# Patient Record
Sex: Male | Born: 1950 | Hispanic: Yes | Marital: Married | Smoking: Never smoker
Health system: Southern US, Community
[De-identification: ages and names within clinical notes are randomized; demographics above are authoritative.]

## PROBLEM LIST (undated history)

## (undated) DIAGNOSIS — E785 Hyperlipidemia, unspecified: Secondary | ICD-10-CM

## (undated) DIAGNOSIS — I10 Essential (primary) hypertension: Secondary | ICD-10-CM

## (undated) DIAGNOSIS — E119 Type 2 diabetes mellitus without complications: Secondary | ICD-10-CM

## (undated) HISTORY — DX: Hyperlipidemia, unspecified: E78.5

---

## 2008-12-27 ENCOUNTER — Emergency Department (HOSPITAL_COMMUNITY): Admission: EM | Admit: 2008-12-27 | Discharge: 2008-12-27 | Payer: Self-pay | Admitting: Emergency Medicine

## 2010-08-03 ENCOUNTER — Encounter: Payer: Self-pay | Admitting: Emergency Medicine

## 2010-10-19 LAB — URINALYSIS, ROUTINE W REFLEX MICROSCOPIC
Ketones, ur: 40 mg/dL — AB
Nitrite: NEGATIVE
Protein, ur: NEGATIVE mg/dL
Urobilinogen, UA: 0.2 mg/dL (ref 0.0–1.0)
pH: 8.5 — ABNORMAL HIGH (ref 5.0–8.0)

## 2010-10-19 LAB — COMPREHENSIVE METABOLIC PANEL
ALT: 33 U/L (ref 0–53)
CO2: 28 mEq/L (ref 19–32)
Calcium: 9.1 mg/dL (ref 8.4–10.5)
Chloride: 104 mEq/L (ref 96–112)
Creatinine, Ser: 1 mg/dL (ref 0.4–1.5)
GFR calc non Af Amer: >60 mL/min (ref >60)
Glucose, Bld: 161 mg/dL — ABNORMAL HIGH (ref 70–99)
Sodium: 140 mEq/L (ref 135–145)
Total Bilirubin: 0.6 mg/dL (ref 0.3–1.2)

## 2010-10-19 LAB — LIPASE, BLOOD: Lipase: 21 U/L (ref 11–59)

## 2010-10-19 LAB — CBC
Hemoglobin: 13.6 g/dL (ref 13.0–17.0)
MCHC: 34.3 g/dL (ref 30.0–36.0)
MCV: 90.1 fL (ref 78.0–100.0)
RBC: 4.42 MIL/uL (ref 4.22–5.81)

## 2010-10-19 LAB — DIFFERENTIAL
Basophils Absolute: 0 10*3/uL (ref 0.0–0.1)
Eosinophils Absolute: 0 10*3/uL (ref 0.0–0.7)
Lymphs Abs: 1.1 10*3/uL (ref 0.7–4.0)
Neutrophils Relative %: 81 % — ABNORMAL HIGH (ref 43–77)

## 2011-05-06 ENCOUNTER — Encounter: Payer: Self-pay | Admitting: Emergency Medicine

## 2011-05-06 ENCOUNTER — Emergency Department (HOSPITAL_COMMUNITY): Payer: Self-pay

## 2011-05-06 ENCOUNTER — Other Ambulatory Visit: Payer: Self-pay

## 2011-05-06 ENCOUNTER — Emergency Department (HOSPITAL_COMMUNITY)
Admission: EM | Admit: 2011-05-06 | Discharge: 2011-05-06 | Disposition: A | Discharge status: Home / Self Care | Payer: Self-pay | Attending: Emergency Medicine | Admitting: Emergency Medicine

## 2011-05-06 DIAGNOSIS — R059 Cough, unspecified: Secondary | ICD-10-CM | POA: Insufficient documentation

## 2011-05-06 DIAGNOSIS — R079 Chest pain, unspecified: Secondary | ICD-10-CM | POA: Insufficient documentation

## 2011-05-06 DIAGNOSIS — R05 Cough: Secondary | ICD-10-CM | POA: Insufficient documentation

## 2011-05-06 DIAGNOSIS — R0602 Shortness of breath: Secondary | ICD-10-CM | POA: Insufficient documentation

## 2011-05-06 HISTORY — DX: Essential (primary) hypertension: I10

## 2011-05-06 LAB — BASIC METABOLIC PANEL
Calcium: 9.2 mg/dL (ref 8.4–10.5)
GFR calc Af Amer: >90 mL/min (ref >90)
GFR calc non Af Amer: 89 mL/min — ABNORMAL LOW (ref >90)
Potassium: 3.8 mEq/L (ref 3.5–5.1)
Sodium: 139 mEq/L (ref 135–145)

## 2011-05-06 LAB — TROPONIN I: Troponin I: <0.3 ng/mL (ref <0.30)

## 2011-05-06 LAB — CBC
Hemoglobin: 12.9 g/dL — ABNORMAL LOW (ref 13.0–17.0)
MCH: 28.5 pg (ref 26.0–34.0)
MCHC: 32.4 g/dL (ref 30.0–36.0)
Platelets: 284 10*3/uL (ref 150–400)
RDW: 14.5 % (ref 11.5–15.5)

## 2011-05-06 NOTE — ED Provider Notes (Signed)
History     CSN: [id number] Arrival date & time: 05/06/2011  3:40 PM   First MD Initiated Contact with Patient 05/06/11 1553      Chief Complaint  Patient presents with  . Cough  . Shortness of Breath  . Chest Pain    (Consider location/radiation/quality/duration/timing/severity/associated sxs/prior treatment) Patient is a 60 y.o. male presenting with cough, shortness of breath, and chest pain. The history is provided by the patient.  Cough Associated symptoms include chest pain and shortness of breath. Pertinent negatives include no headaches.  Shortness of Breath  Associated symptoms include chest pain, cough and shortness of breath.  Chest Pain Primary symptoms include shortness of breath and cough. Pertinent negatives for primary symptoms include no abdominal pain, no nausea and no vomiting.  Pertinent negatives for associated symptoms include no numbness and no weakness.    patient has had chest pain cough and some shortness of breath occasionally for last 20 years. He states that he had an episode back a few months ago another one a few weeks ago. [facility], but the EKG machine is broken he was sent here. He is reportedly hypertensive and bradycardic there. He states that he has had these pains for years. He does not have any pain now. He has an occasional cough. No weight loss. Translator phones were used. No diaphoresis. No fevers. He does not smoke or have a family history of cardiac disease.  Past Medical History  Diagnosis Date  . Hypertension     History reviewed. No pertinent past surgical history.  History reviewed. No pertinent family history.  History  Substance Use Topics  . Smoking status: Not on file  . Smokeless tobacco: Not on file  . Alcohol Use: No      Review of Systems  Constitutional: Negative for activity change and appetite change.  HENT: Negative for neck stiffness.   Eyes: Negative for pain.  Respiratory:  Positive for cough and shortness of breath. Negative for chest tightness.   Cardiovascular: Positive for chest pain. Negative for leg swelling.  Gastrointestinal: Negative for nausea, vomiting, abdominal pain and diarrhea.  Genitourinary: Negative for flank pain.  Musculoskeletal: Negative for back pain.  Skin: Negative for rash.  Neurological: Negative for weakness, numbness and headaches.  Psychiatric/Behavioral: Negative for behavioral problems.    Allergies  Review of patient's allergies indicates no known allergies.  Home Medications  No current outpatient prescriptions on file.  BP 144/68  Pulse 61  Temp(Src) 98.4 F (36.9 C) (Oral)  Resp 20  Wt 165 lb (74.844 kg)  SpO2 97%  Physical Exam  Nursing note and vitals reviewed. Constitutional: He is oriented to person, place, and time. He appears well-developed and well-nourished.  HENT:  Head: Normocephalic and atraumatic.  Eyes: EOM are normal. Pupils are equal, round, and reactive to light.  Neck: Normal range of motion. Neck supple.  Cardiovascular: Normal rate, regular rhythm and normal heart sounds.   No murmur heard. Pulmonary/Chest: Effort normal and breath sounds normal.  Abdominal: Soft. Bowel sounds are normal. He exhibits no distension and no mass. There is no tenderness. There is no rebound and no guarding.  Musculoskeletal: Normal range of motion. He exhibits no edema.  Neurological: He is alert and oriented to person, place, and time. No cranial nerve deficit.  Skin: Skin is warm and dry.  Psychiatric: He has a normal mood and affect.    ED Course  Procedures (including critical care time)  Labs  Reviewed  CBC - Abnormal; Notable for the following:    Hemoglobin 12.9 (*)    All other components within normal limits  BASIC METABOLIC PANEL - Abnormal; Notable for the following:    Glucose, Bld 113 (*)    GFR calc non Af Amer 89 (*)    All other components within normal limits  TROPONIN I   Dg Chest 2  View  05/06/2011  *RADIOLOGY REPORT*  Clinical Data: Chest pain.  CHEST - 2 VIEW  Comparison: None  Findings: The cardiac silhouette, mediastinal and hilar contours are within normal limits.  The lungs are clear.  No pleural effusion.  The bony thorax is intact  IMPRESSION: No acute cardiopulmonary findings.  Original Report Authenticated By: P. Loralie Champagne, M.D.     1. Chest pain     Date: 05/06/2011  Rate: 56  Rhythm: sinus bradycardia  QRS Axis: normal  Intervals: normal  ST/T Wave abnormalities: normal  Conduction Disutrbances:none  Narrative Interpretation:   Old EKG Reviewed: none available     MDM  Chest pain. Said from health department. EKG reassuring. Enzymes negative. Doubt cardiac cause. He'll be discharged home.        Juliet Rude. Rubin Payor, MD 05/06/11 1730

## 2011-05-06 NOTE — ED Notes (Signed)
Dr. Rubin Payor in with pt. Will assess pt when he is finished.

## 2011-05-06 NOTE — ED Notes (Signed)
Pt c/o cough, sob and chest pain when he coughs. Pt was sent over by the health dept.

## 2012-10-14 IMAGING — CR DG CHEST 2V
2 series · 2 of 2 positions shown · non-contrast
Comparison: None

CLINICAL DATA: Chest pain.

CHEST - 2 VIEW

[view not recorded (1 of 2)]
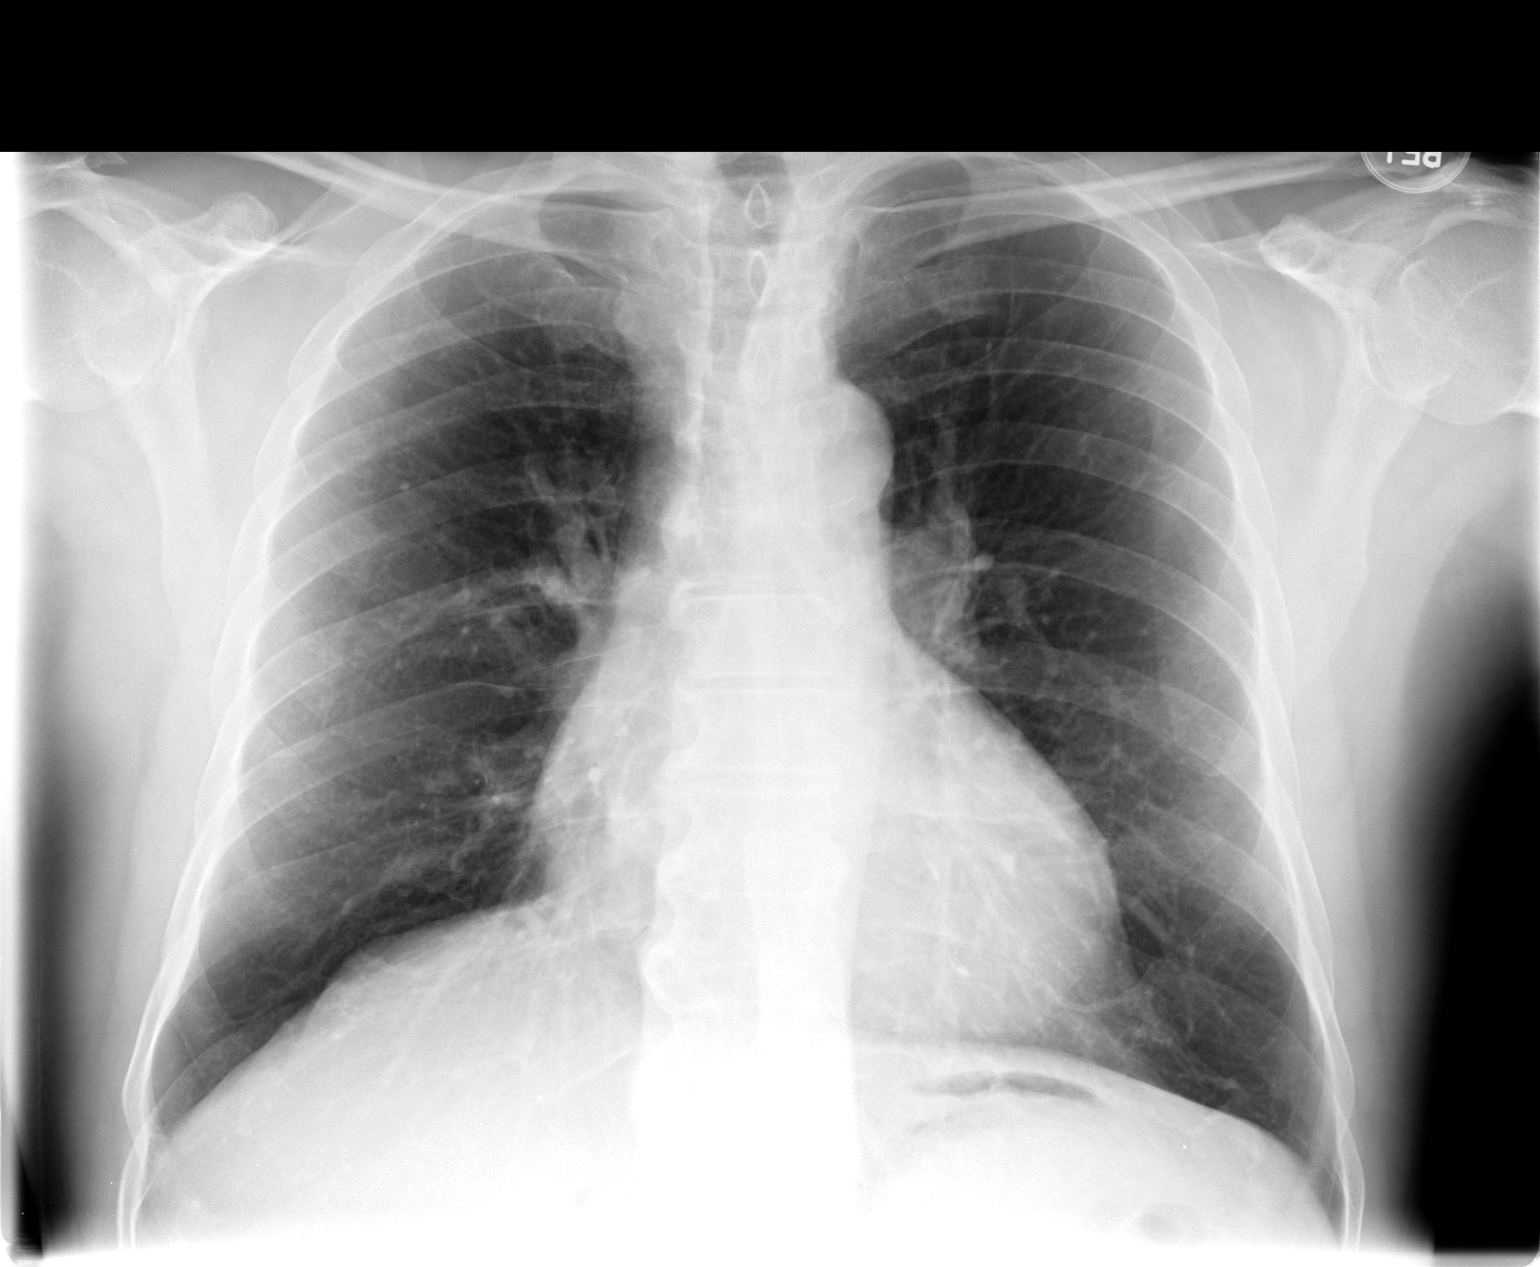

[view not recorded (2 of 2)]
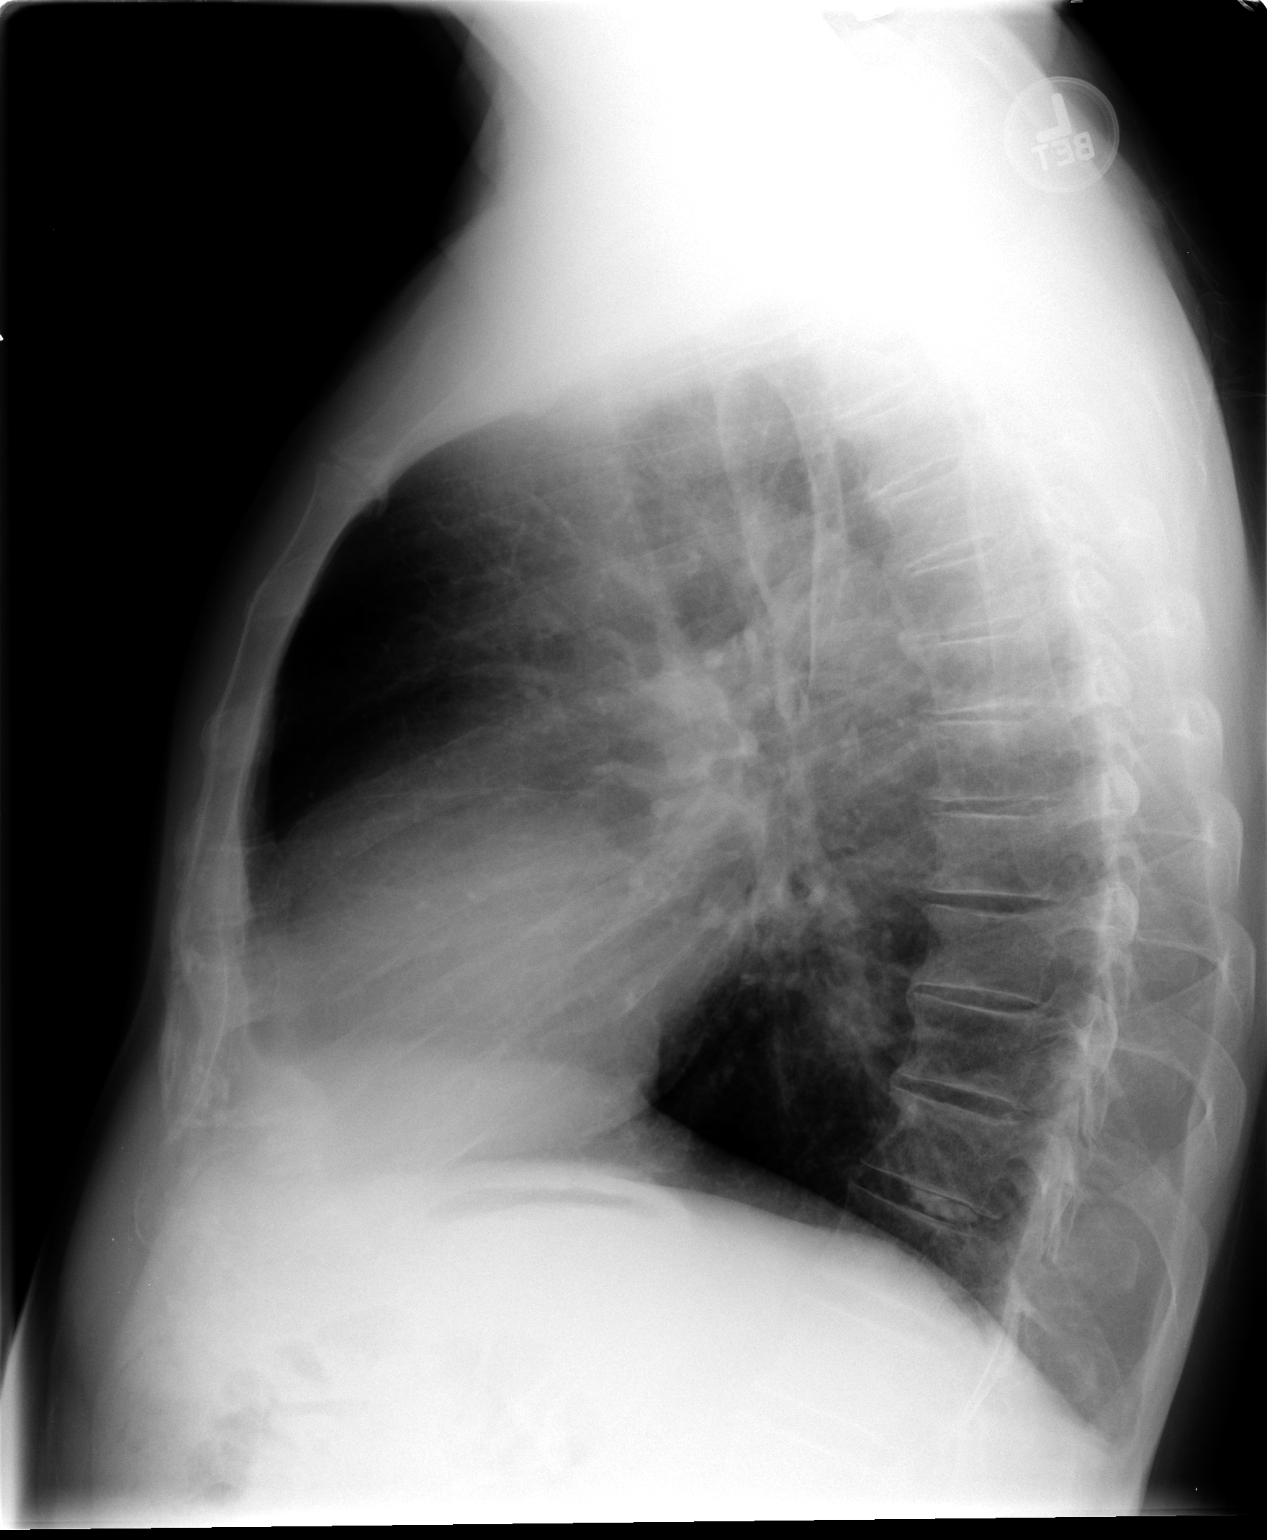

[2 of 2 positions shown; findings below may reference images not displayed]

FINDINGS: The cardiac silhouette, mediastinal and hilar contours
are within normal limits.  The lungs are clear.  No pleural
effusion.  The bony thorax is intact
IMPRESSION: No acute cardiopulmonary findings.

## 2012-11-13 ENCOUNTER — Ambulatory Visit (HOSPITAL_COMMUNITY)
Admission: RE | Admit: 2012-11-13 | Discharge: 2012-11-13 | Disposition: A | Discharge status: Home / Self Care | Payer: Self-pay | Source: Ambulatory Visit | Attending: *Deleted | Admitting: *Deleted

## 2012-11-13 ENCOUNTER — Other Ambulatory Visit (HOSPITAL_COMMUNITY): Payer: Self-pay | Admitting: *Deleted

## 2012-11-13 DIAGNOSIS — R05 Cough: Secondary | ICD-10-CM

## 2012-11-13 DIAGNOSIS — R059 Cough, unspecified: Secondary | ICD-10-CM | POA: Insufficient documentation

## 2016-07-21 ENCOUNTER — Encounter (HOSPITAL_COMMUNITY): Payer: Self-pay | Admitting: Emergency Medicine

## 2016-07-21 ENCOUNTER — Emergency Department (HOSPITAL_COMMUNITY): Payer: Self-pay

## 2016-07-21 ENCOUNTER — Observation Stay (HOSPITAL_COMMUNITY)
Admission: EM | Admit: 2016-07-21 | Discharge: 2016-07-23 | Disposition: A | Discharge status: Home / Self Care | Payer: Self-pay | Attending: Internal Medicine | Admitting: Internal Medicine

## 2016-07-21 DIAGNOSIS — D509 Iron deficiency anemia, unspecified: Secondary | ICD-10-CM

## 2016-07-21 DIAGNOSIS — Z79899 Other long term (current) drug therapy: Secondary | ICD-10-CM | POA: Insufficient documentation

## 2016-07-21 DIAGNOSIS — I10 Essential (primary) hypertension: Secondary | ICD-10-CM | POA: Insufficient documentation

## 2016-07-21 DIAGNOSIS — E119 Type 2 diabetes mellitus without complications: Secondary | ICD-10-CM | POA: Insufficient documentation

## 2016-07-21 DIAGNOSIS — R079 Chest pain, unspecified: Principal | ICD-10-CM

## 2016-07-21 DIAGNOSIS — E1169 Type 2 diabetes mellitus with other specified complication: Secondary | ICD-10-CM

## 2016-07-21 HISTORY — DX: Type 2 diabetes mellitus without complications: E11.9

## 2016-07-21 LAB — CBC WITH DIFFERENTIAL/PLATELET
Basophils Absolute: 0.1 10*3/uL (ref 0.0–0.1)
Basophils Relative: 1 %
Eosinophils Absolute: 0.5 10*3/uL (ref 0.0–0.7)
Eosinophils Relative: 11 %
HEMATOCRIT: 33.9 % — AB (ref 39.0–52.0)
HEMOGLOBIN: 10.7 g/dL — AB (ref 13.0–17.0)
LYMPHS ABS: 2 10*3/uL (ref 0.7–4.0)
Lymphocytes Relative: 42 %
MCH: 23.5 pg — AB (ref 26.0–34.0)
MCHC: 31.6 g/dL (ref 30.0–36.0)
MCV: 74.5 fL — ABNORMAL LOW (ref 78.0–100.0)
MONO ABS: 0.4 10*3/uL (ref 0.1–1.0)
MONOS PCT: 7 %
NEUTROS ABS: 1.9 10*3/uL (ref 1.7–7.7)
NEUTROS PCT: 39 %
Platelets: 318 10*3/uL (ref 150–400)
RBC: 4.55 MIL/uL (ref 4.22–5.81)
RDW: 19.3 % — AB (ref 11.5–15.5)
WBC: 4.7 10*3/uL (ref 4.0–10.5)

## 2016-07-21 LAB — COMPREHENSIVE METABOLIC PANEL
ALK PHOS: 44 U/L (ref 38–126)
ALT: 58 U/L (ref 17–63)
ANION GAP: 11 (ref 5–15)
AST: 43 U/L — ABNORMAL HIGH (ref 15–41)
Albumin: 3.9 g/dL (ref 3.5–5.0)
BILIRUBIN TOTAL: 0.4 mg/dL (ref 0.3–1.2)
BUN: 16 mg/dL (ref 6–20)
CALCIUM: 9.3 mg/dL (ref 8.9–10.3)
CO2: 28 mmol/L (ref 22–32)
CREATININE: 0.78 mg/dL (ref 0.61–1.24)
Chloride: 98 mmol/L — ABNORMAL LOW (ref 101–111)
Glucose, Bld: 112 mg/dL — ABNORMAL HIGH (ref 65–99)
Potassium: 3.8 mmol/L (ref 3.5–5.1)
Sodium: 137 mmol/L (ref 135–145)
TOTAL PROTEIN: 7.9 g/dL (ref 6.5–8.1)

## 2016-07-21 LAB — RETICULOCYTES
RBC.: 4.74 MIL/uL (ref 4.22–5.81)
Retic Count, Absolute: 47.4 10*3/uL (ref 19.0–186.0)
Retic Ct Pct: 1 % (ref 0.4–3.1)

## 2016-07-21 LAB — TROPONIN I
Troponin I: <0.03 ng/mL (ref <0.03)
Troponin I: <0.03 ng/mL (ref <0.03)

## 2016-07-21 LAB — GLUCOSE, CAPILLARY: GLUCOSE-CAPILLARY: 151 mg/dL — AB (ref 65–99)

## 2016-07-21 MED ORDER — ACETAMINOPHEN 325 MG PO TABS: 650.0000 mg | ORAL_TABLET | ORAL | Status: DC | PRN

## 2016-07-21 MED ORDER — INSULIN ASPART 100 UNIT/ML ~~LOC~~ SOLN
3.0000 [IU] | Freq: Three times a day (TID) | SUBCUTANEOUS | Status: DC
  Administered 2016-07-22: 3 [IU] via SUBCUTANEOUS

## 2016-07-21 MED ORDER — ENOXAPARIN SODIUM 40 MG/0.4ML ~~LOC~~ SOLN
40.0000 mg | SUBCUTANEOUS | Status: DC
  Administered 2016-07-21 – 2016-07-22 (×2): 40 mg via SUBCUTANEOUS
  Filled 2016-07-21 (×2): qty 0.4

## 2016-07-21 MED ORDER — ONDANSETRON HCL 4 MG/2ML IJ SOLN: 4.0000 mg | Freq: Four times a day (QID) | INTRAMUSCULAR | Status: DC | PRN

## 2016-07-21 MED ORDER — GI COCKTAIL ~~LOC~~: 30.0000 mL | Freq: Four times a day (QID) | ORAL | Status: DC | PRN

## 2016-07-21 MED ORDER — INSULIN ASPART 100 UNIT/ML ~~LOC~~ SOLN: 0.0000 [IU] | Freq: Every day | SUBCUTANEOUS | Status: DC

## 2016-07-21 MED ORDER — ENOXAPARIN SODIUM 40 MG/0.4ML ~~LOC~~ SOLN: 40.0000 mg | SUBCUTANEOUS | Status: DC

## 2016-07-21 MED ORDER — MORPHINE SULFATE (PF) 2 MG/ML IV SOLN
2.0000 mg | INTRAVENOUS | Status: DC | PRN
  Administered 2016-07-22: 2 mg via INTRAVENOUS
  Filled 2016-07-21: qty 1

## 2016-07-21 MED ORDER — MORPHINE SULFATE (PF) 2 MG/ML IV SOLN
2.0000 mg | Freq: Once | INTRAVENOUS | Status: AC
  Administered 2016-07-21: 2 mg via INTRAVENOUS
  Filled 2016-07-21: qty 1

## 2016-07-21 MED ORDER — INSULIN ASPART 100 UNIT/ML ~~LOC~~ SOLN
0.0000 [IU] | Freq: Three times a day (TID) | SUBCUTANEOUS | Status: DC
  Administered 2016-07-22: 2 [IU] via SUBCUTANEOUS

## 2016-07-21 NOTE — H&P (Signed)
History and Physical    Corbyn Wildey ZOX:[id number] DOB: Dec 04, 1950 DOA: 07/21/2016  Referring MD/NP/PA: Bethann Berkshire, EDP PCP: MUSE,ROCHELLE D., PA-C  Patient coming from: Home  Chief Complaint: Chest Pain  HPI: Brent Bryan is a 66 y.o. male with h/o HTN, DM for many years. Spanish-speaking only. Has been having chest tightness for at least 2 years related to exertion. Has never had this evaluated before. Today was at the dentist for a tooth extraction that had to be terminated when he developed what he describes as squeezing SSCP that radiated into his left arm. He felt like he couldn't take a deep breath and became very anxious. The dentist sent him to the ED for evaluation. Pain resolved after 2 doses of NTG. W/U in the ED is so far WNL. Troponin negative, EKG without acute changes, labs essentially within normal limits with the exception of a microcytic anemia with a Hb of 10.7. Admission requested.  Past Medical/Surgical History: Past Medical History:  Diagnosis Date  . Diabetes mellitus without complication (HCC)   . Hypertension     History reviewed. No pertinent surgical history.  Social History:  reports that he has never smoked. He has never used smokeless tobacco. He reports that he does not drink alcohol or use drugs.  Allergies: No Known Allergies  Family History:  No h/o heart disease in family members  Prior to Admission medications   Not on File    Review of Systems:  Constitutional: Denies fever, chills, diaphoresis, appetite change and fatigue.  HEENT: Denies photophobia, eye pain, redness, hearing loss, ear pain, congestion, sore throat, rhinorrhea, sneezing, mouth sores, trouble swallowing, neck pain, neck stiffness and tinnitus.   Respiratory: Denies SOB cough,   and wheezing.   Cardiovascular: Denies chest pain, palpitations and leg swelling.  Gastrointestinal: Denies nausea, vomiting, abdominal pain, diarrhea, constipation, blood in stool and  abdominal distention.  Genitourinary: Denies dysuria, urgency, frequency, hematuria, flank pain and difficulty urinating.  Endocrine: Denies: hot or cold intolerance, sweats, changes in hair or nails, polyuria, polydipsia. Musculoskeletal: Denies myalgias, back pain, joint swelling, arthralgias and gait problem.  Skin: Denies pallor, rash and wound.  Neurological: Denies dizziness, seizures, syncope, weakness, light-headedness, numbness and headaches.  Hematological: Denies adenopathy. Easy bruising, personal or family bleeding history  Psychiatric/Behavioral: Denies suicidal ideation, mood changes, confusion, nervousness, sleep disturbance and agitation    Physical Exam: Vitals:   07/21/16 1415 07/21/16 1430 07/21/16 1445 07/21/16 1500  BP:  149/79  172/83  Pulse: 70 64 66 71  Resp: 13 14 13 14   Temp:      TempSrc:      SpO2: 100% 100% 99% 100%  Weight:      Height:         Constitutional: NAD, calm, comfortable Eyes: PERRL, lids and conjunctivae normal ENMT: Mucous membranes are moist. Posterior pharynx clear of any exudate or lesions.Normal dentition.  Neck: normal, supple, no masses, no thyromegaly Respiratory: clear to auscultation bilaterally, no wheezing, no crackles. Normal respiratory effort. No accessory muscle use.  Cardiovascular: Regular rate and rhythm, no murmurs / rubs / gallops. No extremity edema. 2+ pedal pulses. No carotid bruits.  Abdomen: no tenderness, no masses palpated. No hepatosplenomegaly. Bowel sounds positive.  Musculoskeletal: no clubbing / cyanosis. No joint deformity upper and lower extremities. Good ROM, no contractures. Normal muscle tone.  Skin: no rashes, lesions, ulcers. No induration Neurologic: CN 2-12 grossly intact. Sensation intact, DTR normal. Strength 5/5 in all 4.  Psychiatric: Normal judgment and insight.  Alert and oriented x 3. Normal mood.    Labs on Admission: I have personally reviewed the following labs and imaging  studies  CBC:  Recent Labs Lab 07/21/16 1220  WBC 4.7  NEUTROABS 1.9  HGB 10.7*  HCT 33.9*  MCV 74.5*  PLT 318   Basic Metabolic Panel:  Recent Labs Lab 07/21/16 1220  NA 137  K 3.8  CL 98*  CO2 28  GLUCOSE 112*  BUN 16  CREATININE 0.78  CALCIUM 9.3   GFR: Estimated Creatinine Clearance: 89.1 mL/min (by C-G formula based on SCr of 0.78 mg/dL). Liver Function Tests:  Recent Labs Lab 07/21/16 1220  AST 43*  ALT 58  ALKPHOS 44  BILITOT 0.4  PROT 7.9  ALBUMIN 3.9   No results for input(s): LIPASE, AMYLASE in the last 168 hours. No results for input(s): AMMONIA in the last 168 hours. Coagulation Profile: No results for input(s): INR, PROTIME in the last 168 hours. Cardiac Enzymes:  Recent Labs Lab 07/21/16 1220  TROPONINI <0.03   BNP (last 3 results) No results for input(s): PROBNP in the last 8760 hours. HbA1C: No results for input(s): HGBA1C in the last 72 hours. CBG: No results for input(s): GLUCAP in the last 168 hours. Lipid Profile: No results for input(s): CHOL, HDL, LDLCALC, TRIG, CHOLHDL, LDLDIRECT in the last 72 hours. Thyroid Function Tests: No results for input(s): TSH, T4TOTAL, FREET4, T3FREE, THYROIDAB in the last 72 hours. Anemia Panel: No results for input(s): VITAMINB12, FOLATE, FERRITIN, TIBC, IRON, RETICCTPCT in the last 72 hours. Urine analysis:    Component Value Date/Time   COLORURINE YELLOW 12/27/2008 0204   APPEARANCEUR CLEAR 12/27/2008 0204   LABSPEC 1.020 12/27/2008 0204   PHURINE 8.5 (H) 12/27/2008 0204   GLUCOSEU NEGATIVE 12/27/2008 0204   HGBUR NEGATIVE 12/27/2008 0204   BILIRUBINUR NEGATIVE 12/27/2008 0204   KETONESUR 40 (A) 12/27/2008 0204   PROTEINUR NEGATIVE 12/27/2008 0204   UROBILINOGEN 0.2 12/27/2008 0204   NITRITE NEGATIVE 12/27/2008 0204   LEUKOCYTESUR  12/27/2008 0204    NEGATIVE MICROSCOPIC NOT DONE ON URINES WITH NEGATIVE PROTEIN, BLOOD, LEUKOCYTES, NITRITE, OR GLUCOSE <1000 mg/dL.   Sepsis  Labs: @LABRCNTIP (procalcitonin:4,lacticidven:4) )No results found for this or any previous visit (from the past 240 hour(s)).   Radiological Exams on Admission: Dg Chest Portable 1 View  Result Date: 07/21/2016 CLINICAL DATA:  66 year old male with left chest pain radiating to the arm for 3 days. Initial encounter. EXAM: PORTABLE CHEST 1 VIEW COMPARISON:  11/13/2012 chest radiographs. FINDINGS: Portable AP semi upright view at 1152 hours. Stable cardiac size at the upper limits of normal to mildly enlarged. Other mediastinal contours are within normal limits. Allowing for portable technique the lungs are clear. No pneumothorax or pleural effusion. No acute osseous abnormality identified. IMPRESSION: No acute cardiopulmonary abnormality. Electronically Signed   By: Odessa Fleming M.D.   On: 07/21/2016 12:10    EKG: Independently reviewed. NSR, normal axis, no acute ischemic changes.  Assessment/Plan Principal Problem:   Chest pain Active Problems:   HTN (hypertension)   DM coma, type 2, not at goal [facility])   Microcytic anemia    Chest Pain -Seems typical. -With age, risk factors, has a HEART score of at least 4. -Agree with admission to telemetry for cardiac rule out: monitor troponins, recheck EKG in am, ECHO. -Cardiology consultation requested. -Will keep NPO after midnight as I believe a stress test is warranted in this patient.  HTN -Restart home meds once available (pharm tech has not  collected med rec at time of admission and patient is unaware of medications he takes).  DM II -Check A1C. -Place on sensitive SSI while in the hospital.  Microcytic Anemia -Check anemia panel. -hb 10.7, no need for transfusion at present,   DVT prophylaxis: lovenox  Code Status: full code  Family Communication: patient only  Disposition Plan: pending cardiac work up  CiscoConsults called: cardiology  Admission status: observation    Time Spent: 85 minutes  Chaya JanHERNANDEZ ACOSTA,ESTELA MD [facility] Pager [phone]  If 7PM-7AM, please contact night-coverage [email or web address] Password [email or web address]  07/21/2016, 3:26 PM

## 2016-07-21 NOTE — ED Provider Notes (Signed)
[facility]-EMERGENCY DEPT Provider Note   CSN: [id number] Arrival date & time: 07/21/16  1114   By signing my name below, I, Cynda Acres, attest that this documentation has been prepared under the direction and in the presence of Bethann Berkshire, MD. Electronically Signed: Cynda Acres, Scribe. 07/21/16. 11:47 AM.  History   Chief Complaint Chief Complaint  Patient presents with  . Chest Pain    HPI Comments: Brent Bryan is a 66 y.o. male with a hx of DM and HTN,  who presents to the Emergency Department by ambulance  complaining of left-sided chest pain with an unknown period of time. Patient has associated radiation down the left arm. Patient received one nitroglycerin and the pain improved. Patients history is limited by language barrier.    The history is provided by the patient. The history is limited by a language barrier. No language interpreter was used.  Chest Pain   This is a new problem. The current episode started yesterday (Unknown). Associated with: radiation down the left arm  The pain radiates to the left arm. Pertinent negatives include no abdominal pain, no back pain, no cough and no headaches. He has tried nitroglycerin for the symptoms. The treatment provided mild relief.  Pertinent negatives for past medical history include no seizures.    Past Medical History:  Diagnosis Date  . Diabetes mellitus without complication (HCC)   . Hypertension     There are no active problems to display for this patient.   History reviewed. No pertinent surgical history.     Home Medications    Prior to Admission medications   Not on File    Family History No family history on file.  Social History Social History  Substance Use Topics  . Smoking status: Never Smoker  . Smokeless tobacco: Never Used  . Alcohol use No     Comment: beer occasionally     Allergies   Patient has no known allergies.   Review of Systems Review of Systems  Constitutional:  Negative for appetite change and fatigue.  HENT: Negative for congestion, ear discharge and sinus pressure.   Eyes: Negative for discharge.  Respiratory: Negative for cough.   Cardiovascular: Negative for chest pain.  Gastrointestinal: Negative for abdominal pain and diarrhea.  Genitourinary: Negative for frequency and hematuria.  Musculoskeletal: Negative for back pain.  Skin: Negative for rash.  Neurological: Negative for seizures and headaches.  Psychiatric/Behavioral: Negative for hallucinations.     Physical Exam Updated Vital Signs BP 141/72 (BP Location: Left Arm)   Pulse 64   Temp 98.3 F (36.8 C) (Oral)   Resp 12   Ht 5\' 8"  (1.727 m)   Wt 174 lb (78.9 kg)   SpO2 100%   BMI 26.46 kg/m   Physical Exam  Constitutional: He is oriented to person, place, and time. He appears well-developed.  HENT:  Head: Normocephalic.  Eyes: Conjunctivae and EOM are normal. No scleral icterus.  Neck: Neck supple. No thyromegaly present.  Cardiovascular: Normal rate and regular rhythm.  Exam reveals no gallop and no friction rub.   No murmur heard. Mildly tender to the left anterior chest.   Pulmonary/Chest: No stridor. He has no wheezes. He has no rales. He exhibits no tenderness.  Abdominal: He exhibits no distension. There is no tenderness. There is no rebound.  Musculoskeletal: Normal range of motion. He exhibits no edema.  Lymphadenopathy:    He has no cervical adenopathy.  Neurological: He is oriented to person, place, and time.  He exhibits normal muscle tone. Coordination normal.  Skin: No rash noted. No erythema.  Psychiatric: He has a normal mood and affect. His behavior is normal.     ED Treatments / Results  DIAGNOSTIC STUDIES: Oxygen Saturation is 100% on RA, normal by my interpretation.    COORDINATION OF CARE: 11:46 AM Discussed treatment plan with pt at bedside and pt agreed to plan.  Labs (all labs ordered are listed, but only abnormal results are  displayed) Labs Reviewed - No data to display  EKG  EKG Interpretation None       Radiology No results found.  Procedures Procedures (including critical care time)  Medications Ordered in ED Medications - No data to display   Initial Impression / Assessment and Plan / ED Course  I have reviewed the triage vital signs and the nursing notes.  Pertinent labs & imaging results that were available during my care of the patient were reviewed by me and considered in my medical decision making (see chart for details).  Clinical Course     Patient with chest pain. Patient will be admitted for further workup in the hospital  Final Clinical Impressions(s) / ED Diagnoses   Final diagnoses:  None    New Prescriptions New Prescriptions   No medications on file  The chart was scribed for me under my direct supervision.  I personally performed the history, physical, and medical decision making and all procedures in the evaluation of this patient.. The chart was scribed for me under my direct supervision.  I personally performed the history, physical, and medical decision making and all procedures in the evaluation of this patient.Bethann Berkshire.     Kota Ciancio, MD 07/21/16 [phone]

## 2016-07-21 NOTE — ED Triage Notes (Signed)
Per EMS pt is from health department.  Pt began having chest pain and EMS was called out.  He received one nitroglycerin and pain improved.  12 lead was unremarkable and VS within normal limits.

## 2016-07-22 ENCOUNTER — Observation Stay (HOSPITAL_BASED_OUTPATIENT_CLINIC_OR_DEPARTMENT_OTHER): Payer: Self-pay

## 2016-07-22 DIAGNOSIS — R5383 Other fatigue: Secondary | ICD-10-CM

## 2016-07-22 DIAGNOSIS — R0602 Shortness of breath: Secondary | ICD-10-CM

## 2016-07-22 DIAGNOSIS — R071 Chest pain on breathing: Secondary | ICD-10-CM

## 2016-07-22 DIAGNOSIS — R079 Chest pain, unspecified: Secondary | ICD-10-CM

## 2016-07-22 LAB — VITAMIN B12: Vitamin B-12: 353 pg/mL (ref 180–914)

## 2016-07-22 LAB — ECHOCARDIOGRAM COMPLETE
AVLVOTPG: 4 mmHg
CHL CUP DOP CALC LVOT VTI: 19.3 cm
CHL CUP STROKE VOLUME: 61 mL
E/e' ratio: 10.17
EWDT: 236 ms
FS: 18 % — AB (ref 28–44)
HEIGHTINCHES: 68 in
IVS/LV PW RATIO, ED: 1.03
LA ID, A-P, ES: 42 mm
LA diam index: 2.14 cm/m2
LA vol index: 24.9 mL/m2
LA vol: 48.7 mL
LAVOLA4C: 56.9 mL
LEFT ATRIUM END SYS DIAM: 42 mm
LV E/e' medial: 10.17
LV E/e'average: 10.17
LV dias vol index: 56 mL/m2
LV e' LATERAL: 5.96 cm/s
LVDIAVOL: 110 mL (ref 62–150)
LVOT SV: 61 mL
LVOT area: 3.14 cm2
LVOT peak vel: 95.9 cm/s
LVOTD: 20 mm
LVSYSVOL: 49 mL (ref 21–61)
LVSYSVOLIN: 25 mL/m2
MV Dec: 236
MV pk E vel: 60.6 m/s
MVPKAVEL: 78 m/s
PW: 12.7 mm — AB (ref 0.6–1.1)
RV LATERAL S' VELOCITY: 13.8 cm/s
RV sys press: 26 mmHg
Reg peak vel: 238 cm/s
Simpson's disk: 56
TAPSE: 23 mm
TDI e' lateral: 5.96
TDI e' medial: 4.12
TR max vel: 238 cm/s
WEIGHTICAEL: 2784 [oz_av]

## 2016-07-22 LAB — GLUCOSE, CAPILLARY
Glucose-Capillary: 107 mg/dL — ABNORMAL HIGH (ref 65–99)
Glucose-Capillary: 97 mg/dL (ref 65–99)
Glucose-Capillary: 158 mg/dL — ABNORMAL HIGH (ref 65–99)
Glucose-Capillary: 103 mg/dL — ABNORMAL HIGH (ref 65–99)

## 2016-07-22 LAB — LIPID PANEL
CHOLESTEROL: 203 mg/dL — AB (ref 0–200)
HDL: 44 mg/dL (ref >40)
LDL Cholesterol: 128 mg/dL — ABNORMAL HIGH (ref 0–99)
Total CHOL/HDL Ratio: 4.6 RATIO
Triglycerides: 157 mg/dL — ABNORMAL HIGH (ref <150)
VLDL: 31 mg/dL (ref 0–40)

## 2016-07-22 LAB — FERRITIN: FERRITIN: 23 ng/mL — AB (ref 24–336)

## 2016-07-22 LAB — IRON AND TIBC
IRON: 65 ug/dL (ref 45–182)
SATURATION RATIOS: 13 % — AB (ref 17.9–39.5)
TIBC: 486 ug/dL — AB (ref 250–450)
UIBC: 421 ug/dL

## 2016-07-22 LAB — TROPONIN I: Troponin I: <0.03 ng/mL (ref <0.03)

## 2016-07-22 LAB — FOLATE: Folate: 25.7 ng/mL (ref >5.9)

## 2016-07-22 MED ORDER — AMLODIPINE BESYLATE 5 MG PO TABS
10.0000 mg | ORAL_TABLET | Freq: Every day | ORAL | Status: DC
  Administered 2016-07-22 – 2016-07-23 (×2): 10 mg via ORAL
  Filled 2016-07-22 (×2): qty 2

## 2016-07-22 MED ORDER — ROSUVASTATIN CALCIUM 10 MG PO TABS
10.0000 mg | ORAL_TABLET | Freq: Every day | ORAL | Status: DC
  Administered 2016-07-22: 10 mg via ORAL
  Filled 2016-07-22: qty 1

## 2016-07-22 MED ORDER — LISINOPRIL 10 MG PO TABS
20.0000 mg | ORAL_TABLET | Freq: Every day | ORAL | Status: DC
  Administered 2016-07-22 – 2016-07-23 (×2): 20 mg via ORAL
  Filled 2016-07-22 (×2): qty 2

## 2016-07-22 NOTE — Progress Notes (Signed)
Patient complaining of chest pain 5/10 with radiation to left shoulder. Morphine 2 mg administered. Vital signs are as follows.   07/22/16 1417  Vitals  Temp 98.5 F (36.9 C)  Temp Source Oral  BP (!) 144/74  BP Location Left Arm  BP Method Automatic  Patient Position (if appropriate) Lying  Pulse Rate 62  Pulse Rate Source Dinamap  Resp 18  Oxygen Therapy  SpO2 100 %  O2 Device Nasal Cannula  O2 Flow Rate (L/min) 2 L/min  Pain Assessment  Pain Assessment 0-10  Pain Score 5  Pain Type Acute pain  Pain Location Chest  Pain Orientation Left  Pain Radiating Towards left shoulder   Pain Descriptors / Indicators Aching;Constant;Discomfort  Pain Frequency Constant  Pain Onset On-going  Patients Stated Pain Goal 0  Pain Intervention(s) Medication (See eMAR)   Will continue to monitor patient throughout the shift.

## 2016-07-22 NOTE — Progress Notes (Signed)
*  PRELIMINARY RESULTS* Echocardiogram 2D Echocardiogram has been performed.  Stacey DrainWhite, Johnathon Olden J 07/22/2016, 3:32 PM

## 2016-07-22 NOTE — Progress Notes (Signed)
Informed by Cardiology that patient will have stress test in the a.m. Patient will be NPO at midnight.  Telephonic Interpreting was utilized to discuss information concerning Stress Test procedure. Victorio PalmGuadalupe, [phone] is the interpreter utilized. Patient has been informed of procedure and all questions were answered and no further questions at this time. Patient has also indicated that he is experiencing chest pain with pressure 5/10 with radiation to left arm. MD has been notified and received order for EKG stat. Will continue to monitor patient throughout the shift.

## 2016-07-22 NOTE — Consult Note (Signed)
CARDIOLOGY CONSULT NOTE  Patient ID: Brent Bryan MRN: [id number] DOB/AGE: [birth year] 66 y.o.  Admit date: 07/21/2016 Primary Physician: Tylene Fantasia., PA-C Referring Physician: Peggye Pitt MD  Reason for Consultation: chest pain  HPI: The patient is a 66 yr old Spanish-speaking man who has had chest tightness for the past 2 years. I was assisted with translation by Dr. Ardyth Harps.  While getting a tooth extraction yesterday, he had severe retrosternal squeezing chest pain associated with shortness of breath and radiation down the left arm. He has felt progressively fatigued over the past year or so. Currently says pain is much less severe than yesterday. Past medical history includes hypertension and diabetes. Quit smoking 30 yrs ago.  Troponins within normal limits. Has a microcytic anemia (Hgb 10.7, MCV 74.5).  Chest xray without acute cardiopulmonary abnormalities.  ECG performed yesterday which I personally reviewed demonstrated normal sinus rhythm with no ischemic ST segment or T-wave abnormalities, nor any arrhythmias.     No Known Allergies  Current Facility-Administered Medications  Medication Dose Route Frequency Provider Last Rate Last Dose  . acetaminophen (TYLENOL) tablet 650 mg  650 mg Oral Q4H PRN Henderson Cloud, MD      . enoxaparin (LOVENOX) injection 40 mg  40 mg Subcutaneous Q24H Henderson Cloud, MD   40 mg at 07/21/16 2044  . gi cocktail (Maalox,Lidocaine,Donnatal)  30 mL Oral QID PRN Henderson Cloud, MD      . insulin aspart (novoLOG) injection 0-5 Units  0-5 Units Subcutaneous QHS Estela Isaiah Blakes, MD      . insulin aspart (novoLOG) injection 0-9 Units  0-9 Units Subcutaneous TID WC Estela Isaiah Blakes, MD      . insulin aspart (novoLOG) injection 3 Units  3 Units Subcutaneous TID WC Estela Isaiah Blakes, MD      . morphine 2 MG/ML injection 2 mg  2 mg Intravenous Q2H PRN Henderson Cloud, MD      . ondansetron [facility]) injection 4 mg  4 mg Intravenous Q6H PRN Henderson Cloud, MD        Past Medical History:  Diagnosis Date  . Diabetes mellitus without complication (HCC)   . Hypertension     History reviewed. No pertinent surgical history.  Social History   Social History  . Marital status: Married    Spouse name: N/A  . Number of children: N/A  . Years of education: N/A   Occupational History  . Not on file.   Social History Main Topics  . Smoking status: Never Smoker  . Smokeless tobacco: Never Used  . Alcohol use No     Comment: beer occasionally  . Drug use: No  . Sexual activity: Not on file   Other Topics Concern  . Not on file   Social History Narrative  . No narrative on file     No family history of premature CAD in 1st degree relatives.  Prior to Admission medications   Medication Sig Start Date End Date Taking? Authorizing Provider  amLODipine (NORVASC) 10 MG tablet Take 10 mg by mouth daily.   Yes Historical Provider, MD  lisinopril (PRINIVIL,ZESTRIL) 20 MG tablet Take 20 mg by mouth daily.   Yes Historical Provider, MD  metFORMIN (GLUCOPHAGE-XR) 500 MG 24 hr tablet Take 500 mg by mouth daily with breakfast.   Yes Historical Provider, MD  triamcinolone lotion (KENALOG) 0.1 % Apply 1 application topically daily.  Yes Historical Provider, MD     Review of systems complete and found to be negative unless listed above in HPI     Physical exam Blood pressure (!) 142/78, pulse 68, temperature 98.1 F (36.7 C), temperature source Oral, resp. rate 18, height 5\' 8"  (1.727 m), weight 174 lb (78.9 kg), SpO2 98 %. General: NAD Neck: No JVD, no thyromegaly or thyroid nodule.  Lungs: Clear to auscultation bilaterally with normal respiratory effort. CV: Nondisplaced PMI. Regular rate and rhythm, normal S1/S2, no S3/S4, no murmur.  No peripheral edema.  No carotid bruit.  Normal pedal pulses.  Abdomen: Soft, nontender, no  hepatosplenomegaly, no distention.  Skin: Intact without lesions or rashes.  Neurologic: Alert and oriented x 3.  Psych: Normal affect. Extremities: No clubbing or cyanosis.  HEENT: Normal.   ECG: Most recent ECG reviewed.  Labs:   Lab Results  Component Value Date   WBC 4.7 07/21/2016   HGB 10.7 (L) 07/21/2016   HCT 33.9 (L) 07/21/2016   MCV 74.5 (L) 07/21/2016   PLT 318 07/21/2016    Recent Labs Lab 07/21/16 1220  NA 137  K 3.8  CL 98*  CO2 28  BUN 16  CREATININE 0.78  CALCIUM 9.3  PROT 7.9  BILITOT 0.4  ALKPHOS 44  ALT 58  AST 43*  GLUCOSE 112*   Lab Results  Component Value Date   TROPONINI <0.03 07/22/2016   No results found for: CHOL No results found for: HDL No results found for: LDLCALC No results found for: TRIG No results found for: CHOLHDL No results found for: LDLDIRECT       Studies: Dg Chest Portable 1 View  Result Date: 07/21/2016 CLINICAL DATA:  66 year old male with left chest pain radiating to the arm for 3 days. Initial encounter. EXAM: PORTABLE CHEST 1 VIEW COMPARISON:  11/13/2012 chest radiographs. FINDINGS: Portable AP semi upright view at 1152 hours. Stable cardiac size at the upper limits of normal to mildly enlarged. Other mediastinal contours are within normal limits. Allowing for portable technique the lungs are clear. No pneumothorax or pleural effusion. No acute osseous abnormality identified. IMPRESSION: No acute cardiopulmonary abnormality. Electronically Signed   By: Odessa FlemingH  Hall M.D.   On: 07/21/2016 12:10    ASSESSMENT AND PLAN:  1. Chest pain and shortness of breath with progressive fatigue: He has several cardiovascular risk factors and symptoms which appear typical. I will start ASA 81 mg daily. I will check a lipid panel. Given his concomitant diabetes mellitus, guidelines would advocate for statin therapy. I will obtain an exercise Myoview stress test to be performed on 07/23/16.  2. Essential HTN: Mildly elevated. Will  monitor.   Signed: Prentice DockerSuresh Jacqulene Huntley, M.D., F.A.C.C.  07/22/2016, 12:35 PM

## 2016-07-22 NOTE — Progress Notes (Signed)
PROGRESS NOTE    Brent Bryan  QQV:[id number] DOB: 01-18-1951 DOA: 07/21/2016 PCP: Brent Bryan     Brief Narrative:  66 year old man admitted to the hospital from home on 1/10 with complaints of chest pain and shortness of breath. Cardiac risk factors include diabetes, hypertension and may be family history from his mother's side although he is unclear exactly about his mother's history. Seen in consultation by cardiology, plan for stress test on 1/12.   Assessment & Plan:   Principal Problem:   Chest pain Active Problems:   HTN (hypertension)   DM coma, type 2, not at goal [facility](HCC)   Microcytic anemia   Chest pain -Has ruled out for ACS, EKG without acute ischemic changes. -Seen by cardiology with plans for stress test on 1/12.  Type 2 diabetes -We'll controlled, continue current regimen.  Hypertension -Not well controlled, especially in a diabetic. -We'll resume home medications of lisinopril 20 and Norvasc 10.  Microcytic anemia -Ferritin of 13 is highly indicative of iron deficiency, will start first sulfate.  Hyperlipidemia -LDL 128 with total cholesterol of 203, given diabetes will start statin.   DVT prophylaxis: Lovenox Code Status: Full code Family Communication: Patient only Disposition Plan: Pending results of stress testing  Consultants:   Cardiology  Procedures:   Echo pending  Antimicrobials:  Anti-infectives    None       Subjective: Still with some mild chest pain today not as bad as yesterday, no shortness of breath  Objective: Vitals:   07/21/16 2101 07/22/16 0500 07/22/16 0900 07/22/16 1417  BP: (!) 146/75 (!) 142/73 (!) 142/78 (!) 144/74  Pulse: 67 65 68 62  Resp: 18 18 18 18   Temp: 98 F (36.7 C) 97.8 F (36.6 C) 98.1 F (36.7 C) 98.5 F (36.9 C)  TempSrc: Oral Oral Oral Oral  SpO2: 100% 99% 98% 100%  Weight:      Height:       No intake or output data in the 24 hours ending 07/22/16 1601 Filed Weights   07/21/16 1130  Weight: 78.9 kg (174 lb)    Examination:  General exam: Alert, awake, oriented x 3 Respiratory system: Clear to auscultation. Respiratory effort normal. Cardiovascular system:RRR. No murmurs, rubs, gallops. Gastrointestinal system: Abdomen is nondistended, soft and nontender. No organomegaly or masses felt. Normal bowel sounds heard. Central nervous system: Alert and oriented. No focal neurological deficits. Extremities: No C/C/E, +pedal pulses Skin: No rashes, lesions or ulcers Psychiatry: Judgement and insight appear normal. Mood & affect appropriate.     Data Reviewed: I have personally reviewed following labs and imaging studies  CBC:  Recent Labs Lab 07/21/16 1220  WBC 4.7  NEUTROABS 1.9  HGB 10.7*  HCT 33.9*  MCV 74.5*  PLT 318   Basic Metabolic Panel:  Recent Labs Lab 07/21/16 1220  NA 137  K 3.8  CL 98*  CO2 28  GLUCOSE 112*  BUN 16  CREATININE 0.78  CALCIUM 9.3   GFR: Estimated Creatinine Clearance: 89.1 mL/min (by C-G formula based on SCr of 0.78 mg/dL). Liver Function Tests:  Recent Labs Lab 07/21/16 1220  AST 43*  ALT 58  ALKPHOS 44  BILITOT 0.4  PROT 7.9  ALBUMIN 3.9   No results for input(s): LIPASE, AMYLASE in the last 168 hours. No results for input(s): AMMONIA in the last 168 hours. Coagulation Profile: No results for input(s): INR, PROTIME in the last 168 hours. Cardiac Enzymes:  Recent Labs Lab 07/21/16 1220 07/21/16 2157 07/22/16 0147  TROPONINI <0.03 <0.03 <0.03   BNP (last 3 results) No results for input(s): PROBNP in the last 8760 hours. HbA1C: No results for input(s): HGBA1C in the last 72 hours. CBG:  Recent Labs Lab 07/21/16 2045 07/22/16 0748 07/22/16 1121  GLUCAP 151* 107* 97   Lipid Profile:  Recent Labs  07/22/16 0147  CHOL 203*  HDL 44  LDLCALC 128*  TRIG 157*  CHOLHDL 4.6   Thyroid Function Tests: No results for input(s): TSH, T4TOTAL, FREET4, T3FREE, THYROIDAB in the last  72 hours. Anemia Panel:  Recent Labs  07/21/16 1220 07/21/16 2019 07/21/16 2157  VITAMINB12 353  --   --   FOLATE  --   --  25.7  FERRITIN 23*  --   --   TIBC 486*  --   --   IRON 65  --   --   RETICCTPCT  --  1.0  --    Urine analysis:    Component Value Date/Time   COLORURINE YELLOW 12/27/2008 0204   APPEARANCEUR CLEAR 12/27/2008 0204   LABSPEC 1.020 12/27/2008 0204   PHURINE 8.5 (H) 12/27/2008 0204   GLUCOSEU NEGATIVE 12/27/2008 0204   HGBUR NEGATIVE 12/27/2008 0204   BILIRUBINUR NEGATIVE 12/27/2008 0204   KETONESUR 40 (A) 12/27/2008 0204   PROTEINUR NEGATIVE 12/27/2008 0204   UROBILINOGEN 0.2 12/27/2008 0204   NITRITE NEGATIVE 12/27/2008 0204   LEUKOCYTESUR  12/27/2008 0204    NEGATIVE MICROSCOPIC NOT DONE ON URINES WITH NEGATIVE PROTEIN, BLOOD, LEUKOCYTES, NITRITE, OR GLUCOSE <1000 mg/dL.   Sepsis Labs: @LABRCNTIP (procalcitonin:4,lacticidven:4)  )No results found for this or any previous visit (from the past 240 hour(s)).       Radiology Studies: Dg Chest Portable 1 View  Result Date: 07/21/2016 CLINICAL DATA:  66 year old male with left chest pain radiating to the arm for 3 days. Initial encounter. EXAM: PORTABLE CHEST 1 VIEW COMPARISON:  11/13/2012 chest radiographs. FINDINGS: Portable AP semi upright view at 1152 hours. Stable cardiac size at the upper limits of normal to mildly enlarged. Other mediastinal contours are within normal limits. Allowing for portable technique the lungs are clear. No pneumothorax or pleural effusion. No acute osseous abnormality identified. IMPRESSION: No acute cardiopulmonary abnormality. Electronically Signed   By: Brent Bryan M.D.   On: 07/21/2016 12:10        Scheduled Meds: . enoxaparin (LOVENOX) injection  40 mg Subcutaneous Q24H  . insulin aspart  0-5 Units Subcutaneous QHS  . insulin aspart  0-9 Units Subcutaneous TID WC  . insulin aspart  3 Units Subcutaneous TID WC   Continuous Infusions:   LOS: 0 days    Time  spent: 25 minutes. Greater than 50% of this time was spent in direct contact with the patient coordinating care.     Chaya Jan, MD [facility] Pager [phone]  If 7PM-7AM, please contact night-coverage [email or web address] Password [email or web address] 07/22/2016, 4:01 PM

## 2016-07-23 ENCOUNTER — Encounter (HOSPITAL_COMMUNITY): Payer: Self-pay

## 2016-07-23 ENCOUNTER — Observation Stay (HOSPITAL_BASED_OUTPATIENT_CLINIC_OR_DEPARTMENT_OTHER): Payer: Self-pay

## 2016-07-23 DIAGNOSIS — R079 Chest pain, unspecified: Secondary | ICD-10-CM

## 2016-07-23 DIAGNOSIS — I429 Cardiomyopathy, unspecified: Secondary | ICD-10-CM

## 2016-07-23 DIAGNOSIS — E782 Mixed hyperlipidemia: Secondary | ICD-10-CM

## 2016-07-23 LAB — GLUCOSE, CAPILLARY
GLUCOSE-CAPILLARY: 120 mg/dL — AB (ref 65–99)
GLUCOSE-CAPILLARY: 138 mg/dL — AB (ref 65–99)
GLUCOSE-CAPILLARY: 121 mg/dL — AB (ref 65–99)

## 2016-07-23 LAB — NM MYOCAR MULTI W/SPECT W/WALL MOTION / EF
LHR: 0.19
LV sys vol: 73 mL
LVDIAVOL: 134 mL (ref 62–150)
Peak HR: 110 {beats}/min
Rest HR: 65 {beats}/min
SDS: 5
SRS: 0
SSS: 5
TID: 0.73

## 2016-07-23 MED ORDER — TECHNETIUM TC 99M TETROFOSMIN IV KIT
30.0000 | PACK | Freq: Once | INTRAVENOUS | Status: AC | PRN
  Administered 2016-07-23: 30 via INTRAVENOUS

## 2016-07-23 MED ORDER — ROSUVASTATIN CALCIUM 10 MG PO TABS: 10.0000 mg | ORAL_TABLET | Freq: Every day | ORAL | 2 refills | Status: AC

## 2016-07-23 MED ORDER — FERROUS SULFATE 325 (65 FE) MG PO TABS: 325.0000 mg | ORAL_TABLET | Freq: Every day | ORAL | 3 refills | Status: AC

## 2016-07-23 MED ORDER — TECHNETIUM TC 99M TETROFOSMIN IV KIT
10.0000 | PACK | Freq: Once | INTRAVENOUS | Status: AC | PRN
  Administered 2016-07-23: 10 via INTRAVENOUS

## 2016-07-23 MED ORDER — REGADENOSON 0.4 MG/5ML IV SOLN
INTRAVENOUS | Status: AC
  Administered 2016-07-23: 0.4 mg via INTRAVENOUS
  Filled 2016-07-23: qty 5

## 2016-07-23 MED ORDER — SODIUM CHLORIDE 0.9% FLUSH
INTRAVENOUS | Status: AC
  Administered 2016-07-23: 10 mL via INTRAVENOUS
  Filled 2016-07-23: qty 10

## 2016-07-23 MED ORDER — LISINOPRIL 20 MG PO TABS: 20.0000 mg | ORAL_TABLET | Freq: Every day | ORAL | 2 refills | Status: AC

## 2016-07-23 MED ORDER — METFORMIN HCL ER 500 MG PO TB24: 500.0000 mg | ORAL_TABLET | Freq: Every day | ORAL | 2 refills | Status: AC

## 2016-07-23 NOTE — Progress Notes (Signed)
Pt's IV catheter removed and intact. Pt's IV site clean dry and intact. Discharge instructions were reviewed and discussed with patient's son Thomasenia SalesFreddy. Pt's son verbalized understanding of discharge instructions. All questions were answered and no further questions at this time. Pt in stable condition and in no acute distress at time of discharge. Pt escorted by RN.

## 2016-07-23 NOTE — Discharge Summary (Signed)
Physician Discharge Summary  Brent Bryan WUJ:[id number] DOB: Mar 28, 1951 DOA: 07/21/2016  PCP: MUSE,ROCHELLE D., PA-C  Admit date: 07/21/2016 Discharge date: 07/23/2016  Time spent: 45 minutes  Recommendations for Outpatient Follow-up:  -Will be discharged home today in stable condition. -Advised follow-up with primary care provider in 2 weeks and with cardiologist if symptoms persist.   Discharge Diagnoses:  Principal Problem:   Chest pain Active Problems:   HTN (hypertension)   DM coma, type 2, not at goal [facility])   Microcytic anemia   Discharge Condition: Stable and improved  Filed Weights   07/21/16 1130  Weight: 78.9 kg (174 lb)    History of present illness:  Brent Bryan is a 66 y.o. male with h/o HTN, DM for many years. Spanish-speaking only. Has been having chest tightness for at least 2 years related to exertion. Has never had this evaluated before. Today was at the dentist for a tooth extraction that had to be terminated when he developed what he describes as squeezing SSCP that radiated into his left arm. He felt like he couldn't take a deep breath and became very anxious. The dentist sent him to the ED for evaluation. Pain resolved after 2 doses of NTG. W/U in the ED is so far WNL. Troponin negative, EKG without acute changes, labs essentially within normal limits with the exception of a microcytic anemia with a Hb of 10.7. Admission requested.  Hospital Course:   Chest pain -Has ruled out for ACS, EKG without acute ischemic changes. -Stress test performed on 1/12: No myocardial ischemia or scar, nuclear stress EF 46%, this is a low risk study.  Type 2 diabetes -Well controlled, continue current regimen.  Hypertension -Not well controlled, especially in a diabetic. -We'll resume home medications of lisinopril 20 and Norvasc 10.  Microcytic anemia -Ferritin of 13 is highly indicative of iron deficiency, will start first  sulfate.  Hyperlipidemia -LDL 128 with total cholesterol of 203, given diabetes will start statin.  Procedures:  As above   Consultations:  Cardiology  Discharge Instructions  Discharge Instructions    Diet - low sodium heart healthy    Complete by:  As directed    Increase activity slowly    Complete by:  As directed      Allergies as of 07/23/2016   No Known Allergies     Medication List    STOP taking these medications   triamcinolone lotion 0.1 % Commonly known as:  KENALOG     TAKE these medications   amLODipine 10 MG tablet Commonly known as:  NORVASC Take 10 mg by mouth daily.   ferrous sulfate 325 (65 FE) MG tablet Take 1 tablet (325 mg total) by mouth daily with breakfast.   lisinopril 20 MG tablet Commonly known as:  PRINIVIL,ZESTRIL Take 1 tablet (20 mg total) by mouth daily.   metFORMIN 500 MG 24 hr tablet Commonly known as:  GLUCOPHAGE-XR Take 1 tablet (500 mg total) by mouth daily with breakfast.   rosuvastatin 10 MG tablet Commonly known as:  CRESTOR Take 1 tablet (10 mg total) by mouth daily at 6 PM.      No Known Allergies Follow-up Information    Prentice Docker, MD. Schedule an appointment as soon as possible for a visit in 2 week(s).   Specialty:  Cardiology Why:  If symptoms persist Contact information: [address] [place] [address] [phone]            The results of significant  diagnostics from this hospitalization (including imaging, microbiology, ancillary and laboratory) are listed below for reference.    Significant Diagnostic Studies: Nm Myocar Multi W/spect W/wall Motion / Ef  Result Date: 07/23/2016  No ST segment changes. No myocardial ischemia or scar.  Nuclear stress EF: 46%.  This is a low risk study.    Dg Chest Portable 1 View  Result Date: 07/21/2016 CLINICAL DATA:  66 year old male with left chest pain radiating to the arm for 3 days. Initial encounter. EXAM: PORTABLE CHEST 1 VIEW  COMPARISON:  11/13/2012 chest radiographs. FINDINGS: Portable AP semi upright view at 1152 hours. Stable cardiac size at the upper limits of normal to mildly enlarged. Other mediastinal contours are within normal limits. Allowing for portable technique the lungs are clear. No pneumothorax or pleural effusion. No acute osseous abnormality identified. IMPRESSION: No acute cardiopulmonary abnormality. Electronically Signed   By: Odessa FlemingH  Hall M.D.   On: 07/21/2016 12:10    Microbiology: No results found for this or any previous visit (from the past 240 hour(s)).   Labs: Basic Metabolic Panel:  Recent Labs Lab 07/21/16 1220  NA 137  K 3.8  CL 98*  CO2 28  GLUCOSE 112*  BUN 16  CREATININE 0.78  CALCIUM 9.3   Liver Function Tests:  Recent Labs Lab 07/21/16 1220  AST 43*  ALT 58  ALKPHOS 44  BILITOT 0.4  PROT 7.9  ALBUMIN 3.9   No results for input(s): LIPASE, AMYLASE in the last 168 hours. No results for input(s): AMMONIA in the last 168 hours. CBC:  Recent Labs Lab 07/21/16 1220  WBC 4.7  NEUTROABS 1.9  HGB 10.7*  HCT 33.9*  MCV 74.5*  PLT 318   Cardiac Enzymes:  Recent Labs Lab 07/21/16 1220 07/21/16 2157 07/22/16 0147  TROPONINI <0.03 <0.03 <0.03   BNP: BNP (last 3 results) No results for input(s): BNP in the last 8760 hours.  ProBNP (last 3 results) No results for input(s): PROBNP in the last 8760 hours.  CBG:  Recent Labs Lab 07/22/16 1631 07/22/16 2109 07/23/16 0841 07/23/16 1217 07/23/16 1554  GLUCAP 158* 103* 120* 138* 121*       Signed:  HERNANDEZ ACOSTA,Laquenta Whitsell  [facility] Pager: [phone] 07/23/2016, 4:14 PM

## 2016-07-23 NOTE — Progress Notes (Signed)
Patient Name: Brent Bryan Date of Encounter: 07/23/2016  Primary Cardiologist: Prentice Docker MD  Hospital Problem List     Principal Problem:   Chest pain Active Problems:   HTN (hypertension)   DM coma, type 2, not at goal [facility])   Microcytic anemia     Subjective   Patient assessed in stress lab. Interpretor used in assessment questions. Denies severe pain, but feels pressure in his chest which is always there.   Inpatient Medications    Scheduled Meds: . amLODipine  10 mg Oral Daily  . enoxaparin (LOVENOX) injection  40 mg Subcutaneous Q24H  . insulin aspart  0-5 Units Subcutaneous QHS  . insulin aspart  0-9 Units Subcutaneous TID WC  . insulin aspart  3 Units Subcutaneous TID WC  . lisinopril  20 mg Oral Daily  . rosuvastatin  10 mg Oral q1800   Continuous Infusions:  PRN Meds: acetaminophen, gi cocktail, morphine injection, ondansetron (ZOFRAN) IV   Vital Signs    Vitals:   07/22/16 2100 07/23/16 0100 07/23/16 0500 07/23/16 0900  BP: (!) 150/70 138/61 123/61 128/70  Pulse: 60 (!) 58 60 65  Resp: 18 18 18 16   Temp: 97.7 F (36.5 C) 97.7 F (36.5 C) 97.8 F (36.6 C) 98.5 F (36.9 C)  TempSrc: Oral Oral Oral Oral  SpO2: 100% 99% 99% 98%  Weight:      Height:       No intake or output data in the 24 hours ending 07/23/16 1000 Filed Weights   07/21/16 1130  Weight: 174 lb (78.9 kg)    Physical Exam    GEN: Well nourished, well developed, in no acute distress.  HEENT: Grossly normal.  Neck: Supple, no JVD, carotid bruits, or masses. Cardiac: RRR, no murmurs, rubs, or gallops. No clubbing, cyanosis, edema.  Radials/DP/PT 2+ and equal bilaterally.  Respiratory:  Respirations regular and unlabored, clear to auscultation bilaterally. GI: Soft, nontender, nondistended, BS + x 4. MS: no deformity or atrophy. Skin: warm and dry, no rash. Neuro:  Strength and sensation are intact. Psych: AAOx3.  Normal affect.  Labs    CBC  Recent Labs  07/21/16 1220  WBC 4.7  NEUTROABS 1.9  HGB 10.7*  HCT 33.9*  MCV 74.5*  PLT 318   Basic Metabolic Panel  Recent Labs  07/21/16 1220  NA 137  K 3.8  CL 98*  CO2 28  GLUCOSE 112*  BUN 16  CREATININE 0.78  CALCIUM 9.3   Liver Function Tests  Recent Labs  07/21/16 1220  AST 43*  ALT 58  ALKPHOS 44  BILITOT 0.4  PROT 7.9  ALBUMIN 3.9   Cardiac Enzymes  Recent Labs  07/21/16 1220 07/21/16 2157 07/22/16 0147  TROPONINI <0.03 <0.03 <0.03   Fasting Lipid Panel  Recent Labs  07/22/16 0147  CHOL 203*  HDL 44  LDLCALC 128*  TRIG 157*  CHOLHDL 4.6    Telemetry    Sinus bradycardia, HR in the 60's at rest.  Personally Reviewed  ECG    - Personally Reviewed  Radiology    Dg Chest Portable 1 View  Result Date: 07/21/2016 CLINICAL DATA:  66 year old male with left chest pain radiating to the arm for 3 days. Initial encounter. EXAM: PORTABLE CHEST 1 VIEW COMPARISON:  11/13/2012 chest radiographs. FINDINGS: Portable AP semi upright view at 1152 hours. Stable cardiac size at the upper limits of normal to mildly enlarged. Other mediastinal contours are within normal limits. Allowing for portable technique the  lungs are clear. No pneumothorax or pleural effusion. No acute osseous abnormality identified. IMPRESSION: No acute cardiopulmonary abnormality. Electronically Signed   By: Odessa FlemingH  Hall M.D.   On: 07/21/2016 12:10    Cardiac Studies   Echo 07/22/2016 Left ventricle: The cavity size was normal. Wall thickness was   increased in a pattern of mild LVH. Systolic function was mildly   reduced. The estimated ejection fraction was 45%. Diffuse   hypokinesis. Doppler parameters are consistent with abnormal left   ventricular relaxation (grade 1 diastolic dysfunction). - Regional wall motion abnormality: Moderate hypokinesis of the   apical lateral myocardium. - Aortic valve: Mildly calcified annulus. Trileaflet; mildly   calcified leaflets. There was mild  regurgitation. - Mitral valve: There was mild regurgitation.   Patient Profile     66 y/o Spanish speaking male admission for chest tightness, with past medical history of hypertension, and diabetes.   Assessment & Plan    1. Chest Pain:  Typical and atypical features. Troponin is negative X 3. No acute EKG changes noted in stress portion of test. Nuclear scintigraphy to follow.   2. Hypertension: Controlled currently on amlodipine and lisinopril.   3. Hypercholesterolemia:Continue statin therapy.   Signed, Joni ReiningKathryn Lawrence, NP  07/23/2016, 10:00 AM   The patient was seen and examined, and I agree with the history, physical exam, assessment and plan as documented above, with modifications as noted below. Nuclear stress test did not show ischemia. Echo showed mildly reduced LV systolic function, EF 45%. Due to low normal heart rate, unable to initiate beta blocker. LDL elevated at 128, agree with initiation of Crestor. Would still continue ASA 81 mg daily. He may have microvascular ischemia. Can try Ranexa 500 mg bid. I will follow up with him in our office. He can be discharged from my standpoint.  Prentice DockerSuresh Koneswaran, MD, [facility] CampusFACC  07/23/2016 1:34 PM

## 2017-01-07 ENCOUNTER — Ambulatory Visit (INDEPENDENT_AMBULATORY_CARE_PROVIDER_SITE_OTHER): Payer: Self-pay | Admitting: Family

## 2017-01-07 ENCOUNTER — Encounter: Payer: Self-pay | Admitting: Family

## 2017-01-07 VITALS — BP 182/78 | HR 65 | Temp 96.9°F | Ht 65.5 in | Wt 169.9 lb

## 2017-01-07 DIAGNOSIS — R3914 Feeling of incomplete bladder emptying: Secondary | ICD-10-CM

## 2017-01-07 DIAGNOSIS — E1169 Type 2 diabetes mellitus with other specified complication: Secondary | ICD-10-CM

## 2017-01-07 DIAGNOSIS — N401 Enlarged prostate with lower urinary tract symptoms: Secondary | ICD-10-CM

## 2017-01-07 DIAGNOSIS — I10 Essential (primary) hypertension: Secondary | ICD-10-CM

## 2017-01-07 DIAGNOSIS — R339 Retention of urine, unspecified: Secondary | ICD-10-CM

## 2017-01-07 LAB — URINALYSIS, COMPLETE
BILIRUBIN UA: NEGATIVE
GLUCOSE, UA: NEGATIVE
Leukocytes, UA: NEGATIVE
Nitrite, UA: NEGATIVE
PH UA: 6 (ref 5.0–7.5)
Specific Gravity, UA: 1.025 (ref 1.005–1.030)
UUROB: 0.2 mg/dL (ref 0.2–1.0)

## 2017-01-07 LAB — BAYER DCA HB A1C WAIVED: HB A1C (BAYER DCA - WAIVED): 6.2 % (ref <7.0)

## 2017-01-07 LAB — MICROSCOPIC EXAMINATION
Bacteria, UA: NONE SEEN
Epithelial Cells (non renal): NONE SEEN /hpf (ref 0–10)
Renal Epithel, UA: NONE SEEN /hpf
WBC, UA: NONE SEEN /hpf (ref 0–?)

## 2017-01-07 MED ORDER — TAMSULOSIN HCL 0.4 MG PO CAPS: 0.4000 mg | ORAL_CAPSULE | Freq: Every day | ORAL | 3 refills | Status: AC

## 2017-01-07 NOTE — Progress Notes (Signed)
Subjective:    Patient ID: Brent Bryan, male    DOB: 10-18-1950, 66 y.o.   MRN: [id number]  Pt presents to the office today to establish care. With complaints of incomplete bladder emptying. PT states he was on a pill that "helped with frequency". Pt is spanish speaking an interperuter was used.   PT's BP is elevated today, but states he has not taken any of his medications this morning.  Dysuria   This is a new problem. The current episode started more than 1 year ago. The problem occurs every urination. The problem has been waxing and waning. The pain is mild. There has been no fever. Associated symptoms include frequency, hesitancy and urgency.  Benign Prostatic Hypertrophy  The current episode started more than 1 year ago. The problem has been waxing and waning since onset. Irritative symptoms include frequency, nocturia and urgency. Obstructive symptoms include incomplete emptying and an intermittent stream. Associated symptoms include dysuria and hesitancy.  Diabetes  He presents for his follow-up diabetic visit. He has type 2 diabetes mellitus. His disease course has been worsening. Risk factors for coronary artery disease include diabetes mellitus. He is following a generally unhealthy diet. (Does not check BS at home ) Eye exam is not current.      Review of Systems  Gastrointestinal: Positive for abdominal pain.  Genitourinary: Positive for difficulty urinating, dysuria, frequency, hesitancy, incomplete emptying, nocturia and urgency.  All other systems reviewed and are negative.  Social History   Social History  . Marital status: Married    Spouse name: N/A  . Number of children: N/A  . Years of education: N/A   Social History Main Topics  . Smoking status: Never Smoker  . Smokeless tobacco: Never Used  . Alcohol use No     Comment: beer occasionally  . Drug use: No  . Sexual activity: Not on file   Other Topics Concern  . Not on file   Social History  Narrative  . No narrative on file   No family history on file.     Objective:   Physical Exam  Constitutional: He is oriented to person, place, and time. He appears well-developed and well-nourished. No distress.  HENT:  Head: Normocephalic.  Eyes: Pupils are equal, round, and reactive to light. Right eye exhibits no discharge. Left eye exhibits no discharge.  Neck: Normal range of motion. Neck supple. No thyromegaly present.  Cardiovascular: Normal rate, regular rhythm, normal heart sounds and intact distal pulses.   No murmur heard. Pulmonary/Chest: Effort normal and breath sounds normal. No respiratory distress. He has no wheezes.  Abdominal: Soft. Bowel sounds are normal. He exhibits no distension. There is tenderness (milld lower abdomen).  Musculoskeletal: Normal range of motion. He exhibits no edema or tenderness.  Neurological: He is alert and oriented to person, place, and time.  Skin: Skin is warm and dry. No rash noted. No erythema.  Psychiatric: He has a normal mood and affect. His behavior is normal. Judgment and thought content normal.  Vitals reviewed.     BP (!) 182/78   Pulse 65   Temp (!) 96.9 F (36.1 C) (Oral)   Ht 5' 5.5" (1.664 m)   Wt 169 lb 14.4 oz (77.1 kg)   BMI 27.84 kg/m      Assessment & Plan:  1. Urinary retention - Urinalysis, Complete - PSA, total and free - CBC with Differential/Platelet - CMP14+EGFR  2. Incomplete emptying of bladder - PSA, total and free -  CBC with Differential/Platelet - CMP14+EGFR  3. Benign prostatic hyperplasia with incomplete bladder emptying Pt started on Flomax today Avoid caffeine - tamsulosin (FLOMAX) 0.4 MG CAPS capsule; Take 1 capsule (0.4 mg total) by mouth daily.  Dispense: 30 capsule; Refill: 3 - CMP14+EGFR  4. DM coma, type 2, not at goal ([place]) - Bayer DCA Hb A1c Waived - CMP14+EGFR  5. Essential hypertension - CMP14+EGFR  Pt can not read. Long discussion about his medications and details  given about taking his BP medications everyday. Pt is self pay.   Continue all meds Labs pending Diet and exercise encouraged RTO 2 weeks to recheck HTN  Evelina Dun, FNP

## 2017-01-07 NOTE — Patient Instructions (Addendum)
Hiperplasia prosttica benigna (Benign Prostatic Hyperplasia) Una prstata agrandada (hiperplasia prosttica benigna) es frecuente en los hombres mayores. Puede experimentar los siguientes sntomas:  Chorro de orina dbil.  Goteo.  Sensacin de que la vejiga no se ha vaciado completamente.  Dificultad para comenzar a orinar.  Levantarse con frecuencia por la noche para orinar.  Orinar con ms frecuencia durante el da. INSTRUCCIONES PARA EL CUIDADO EN EL HOGAR Controle su hiperplasia prosttica para ver si hay cambios. Las siguientes acciones ayudarn a aliviar cualquier molestia que pueda experimentar:  Tmese tiempo para orinar.  No beba alcohol.  Evite las bebidas que contengan cafena, como el caf, el t, las bebidas cola ya que pueden empeorar el problema.  Evite los descongestivos, antihistamnicos y algunos medicamentos recetados que pueden empeorar el problema.  Haga controles con su mdico para seguir el tratamiento, segn las indicaciones. SOLICITE ATENCIN MDICA SI:  Experimenta dificultad progresiva para el vaciamiento.  El chorro de orina es progresivamente ms pequeo.  Se despierta por la noche con necesidad de orinar con ms frecuencia.  Siente constantemente la necesidad de vaciar la vejiga.  Experimenta prdida de orina, especialmente en pequeas cantidades. SOLICITE ATENCIN MDICA DE INMEDIATO SI:  Siente un dolor que va aumentando al orinar o no puede orinar.  Siente dolor abdominal intenso, vomita, tiene fiebre o se desmaya.  Siente dolor de espalda o tiene sangre en la orina. ASEGRESE DE QUE:  Comprende estas instrucciones.  Controlar su afeccin.  Recibir ayuda de inmediato si no mejora o si empeora. Esta informacin no tiene como fin reemplazar el consejo del mdico. Asegrese de hacerle al mdico cualquier pregunta que tenga. Document Released: 06/28/2005 Document Revised: 07/19/2014 Document Reviewed: 11/28/2012 Elsevier  Interactive Patient Education  2017 Elsevier Inc.  

## 2017-01-08 LAB — CMP14+EGFR
ALBUMIN: 4.3 g/dL (ref 3.6–4.8)
ALT: 40 IU/L (ref 0–44)
AST: 41 IU/L — AB (ref 0–40)
Albumin/Globulin Ratio: 1.2 (ref 1.2–2.2)
Alkaline Phosphatase: 51 IU/L (ref 39–117)
BUN / CREAT RATIO: 21 (ref 10–24)
BUN: 21 mg/dL (ref 8–27)
Bilirubin Total: 0.4 mg/dL (ref 0.0–1.2)
CALCIUM: 9.2 mg/dL (ref 8.6–10.2)
CO2: 21 mmol/L (ref 20–29)
CREATININE: 1.02 mg/dL (ref 0.76–1.27)
Chloride: 104 mmol/L (ref 96–106)
GFR calc Af Amer: 89 mL/min/{1.73_m2} (ref >59)
GFR, EST NON AFRICAN AMERICAN: 77 mL/min/{1.73_m2} (ref >59)
GLOBULIN, TOTAL: 3.5 g/dL (ref 1.5–4.5)
Glucose: 114 mg/dL — ABNORMAL HIGH (ref 65–99)
Potassium: 4.3 mmol/L (ref 3.5–5.2)
SODIUM: 140 mmol/L (ref 134–144)
Total Protein: 7.8 g/dL (ref 6.0–8.5)

## 2017-01-08 LAB — CBC WITH DIFFERENTIAL/PLATELET
BASOS: 1 %
Basophils Absolute: 0.1 10*3/uL (ref 0.0–0.2)
EOS (ABSOLUTE): 0.4 10*3/uL (ref 0.0–0.4)
EOS: 7 %
Hematocrit: 44.6 % (ref 37.5–51.0)
Hemoglobin: 14.3 g/dL (ref 13.0–17.7)
Immature Grans (Abs): 0 10*3/uL (ref 0.0–0.1)
Immature Granulocytes: 0 %
LYMPHS ABS: 2.3 10*3/uL (ref 0.7–3.1)
Lymphs: 49 %
MCH: 30.1 pg (ref 26.6–33.0)
MCHC: 32.1 g/dL (ref 31.5–35.7)
MCV: 94 fL (ref 79–97)
MONOS ABS: 0.4 10*3/uL (ref 0.1–0.9)
Monocytes: 8 %
NEUTROS ABS: 1.7 10*3/uL (ref 1.4–7.0)
Neutrophils: 35 %
Platelets: 266 10*3/uL (ref 150–379)
RBC: 4.75 x10E6/uL (ref 4.14–5.80)
RDW: 14.1 % (ref 12.3–15.4)
WBC: 4.8 10*3/uL (ref 3.4–10.8)

## 2017-01-08 LAB — PSA, TOTAL AND FREE
PROSTATE SPECIFIC AG, SERUM: 0.5 ng/mL (ref 0.0–4.0)
PSA, Free Pct: 22 %
PSA, Free: 0.11 ng/mL

## 2017-01-21 ENCOUNTER — Encounter: Payer: Self-pay | Admitting: Family Medicine

## 2017-01-21 ENCOUNTER — Ambulatory Visit (INDEPENDENT_AMBULATORY_CARE_PROVIDER_SITE_OTHER): Payer: Self-pay | Admitting: Family Medicine

## 2017-01-21 VITALS — BP 115/65 | HR 60 | Temp 97.5°F | Ht 65.5 in | Wt 170.0 lb

## 2017-01-21 DIAGNOSIS — I10 Essential (primary) hypertension: Secondary | ICD-10-CM

## 2017-01-21 DIAGNOSIS — R39198 Other difficulties with micturition: Secondary | ICD-10-CM

## 2017-01-21 DIAGNOSIS — R3911 Hesitancy of micturition: Secondary | ICD-10-CM

## 2017-01-21 NOTE — Progress Notes (Signed)
BP 115/65   Pulse 60   Temp (!) 97.5 F (36.4 C) (Oral)   Ht 5' 5.5" (1.664 m)   Wt 170 lb (77.1 kg)   BMI 27.86 kg/m    Subjective:    Patient ID: Brent Bryan Mungo, male    DOB: 12/20/1950, 66 y.o.   MRN: [id number]  HPI: Brent Bryan Weesner is a 66 y.o. male presenting on 01/21/2017 for Blood Pressure Check   HPI Hypertension Patient is currently on lisinopril and amlodipine, patient was not taking his medications when he was seen last time but is taking them now, and their blood pressure today is 115/65. Patient denies any lightheadedness or dizziness. Patient denies headaches, blurred vision, chest pains, shortness of breath, or weakness. Denies any side effects from medication and is content with current medication.   Prostate and urinary stream issues Patient has been having issues with both slow urinary stream and difficulty starting his stream and urinary hesitancy and nocturia and frequency at night. He was started on Flomax 2 weeks ago and he has not noticed any difference. He denies any burning or blood in his urine.  Relevant past medical, surgical, family and social history reviewed and updated as indicated. Interim medical history since our last visit reviewed. Allergies and medications reviewed and updated.  Review of Systems  Constitutional: Negative for chills and fever.  Eyes: Negative for discharge.  Respiratory: Negative for shortness of breath and wheezing.   Cardiovascular: Negative for chest pain and leg swelling.  Gastrointestinal: Negative for abdominal pain.  Genitourinary: Positive for decreased urine volume and difficulty urinating. Negative for discharge, dysuria, flank pain, scrotal swelling and testicular pain.  Musculoskeletal: Negative for back pain and gait problem.  Skin: Negative for rash.  All other systems reviewed and are negative.   Per HPI unless specifically indicated above        Objective:    BP 115/65   Pulse 60   Temp (!) 97.5  F (36.4 C) (Oral)   Ht 5' 5.5" (1.664 m)   Wt 170 lb (77.1 kg)   BMI 27.86 kg/m   Wt Readings from Last 3 Encounters:  01/21/17 170 lb (77.1 kg)  01/07/17 169 lb 14.4 oz (77.1 kg)  07/21/16 174 lb (78.9 kg)    Physical Exam  Constitutional: He is oriented to person, place, and time. He appears well-developed and well-nourished. No distress.  Eyes: Conjunctivae are normal. No scleral icterus.  Cardiovascular: Normal rate, regular rhythm, normal heart sounds and intact distal pulses.   No murmur heard. Pulmonary/Chest: Effort normal and breath sounds normal. No respiratory distress. He has no wheezes.  Abdominal: Soft. Bowel sounds are normal. He exhibits no distension. There is no tenderness. There is no rebound.  Musculoskeletal: Normal range of motion. He exhibits no edema.  Neurological: He is alert and oriented to person, place, and time. Coordination normal.  Skin: Skin is warm and dry. No rash noted. He is not diaphoretic.  Psychiatric: He has a normal mood and affect. His behavior is normal.  Nursing note and vitals reviewed.     Assessment & Plan:   Problem List Items Addressed This Visit      Cardiovascular and Mediastinum   HTN (hypertension) - Primary    Other Visit Diagnoses    Slow urinary stream       Patient has been on tamsulosin for 2 weeks and not having any improvement and is still having a lot of nighttime issues and stream issues  Relevant Orders   Ambulatory referral to Urology   Urinary hesitancy       Relevant Orders   Ambulatory referral to Urology       Follow up plan: Return in about 6 months (around 07/24/2017), or if symptoms worsen or fail to improve, for Hypertension recheck.  Counseling provided for all of the vaccine components Orders Placed This Encounter  Procedures  . Ambulatory referral to Urology    Arville Care, MD [facility] 01/21/2017, 11:42 AM

## 2017-04-15 ENCOUNTER — Ambulatory Visit: Payer: Self-pay | Admitting: Urology

## 2017-06-24 ENCOUNTER — Ambulatory Visit: Payer: Self-pay | Admitting: Urology

## 2017-07-25 ENCOUNTER — Ambulatory Visit: Payer: Self-pay | Admitting: Family Medicine

## 2017-07-26 ENCOUNTER — Encounter: Payer: Self-pay | Admitting: Family

## 2017-12-30 IMAGING — CR DG CHEST 1V PORT
1 series · 2 of 2 positions shown · non-contrast
Comparison: 11/13/2012 chest radiographs.

CLINICAL DATA: 65-year-old male with left chest pain radiating to
the arm for 3 days. Initial encounter.

EXAM:
PORTABLE CHEST 1 VIEW

[Series 1: ap portable · 0.17mm/px · 2 of 2 slices shown]
[im 1/2]
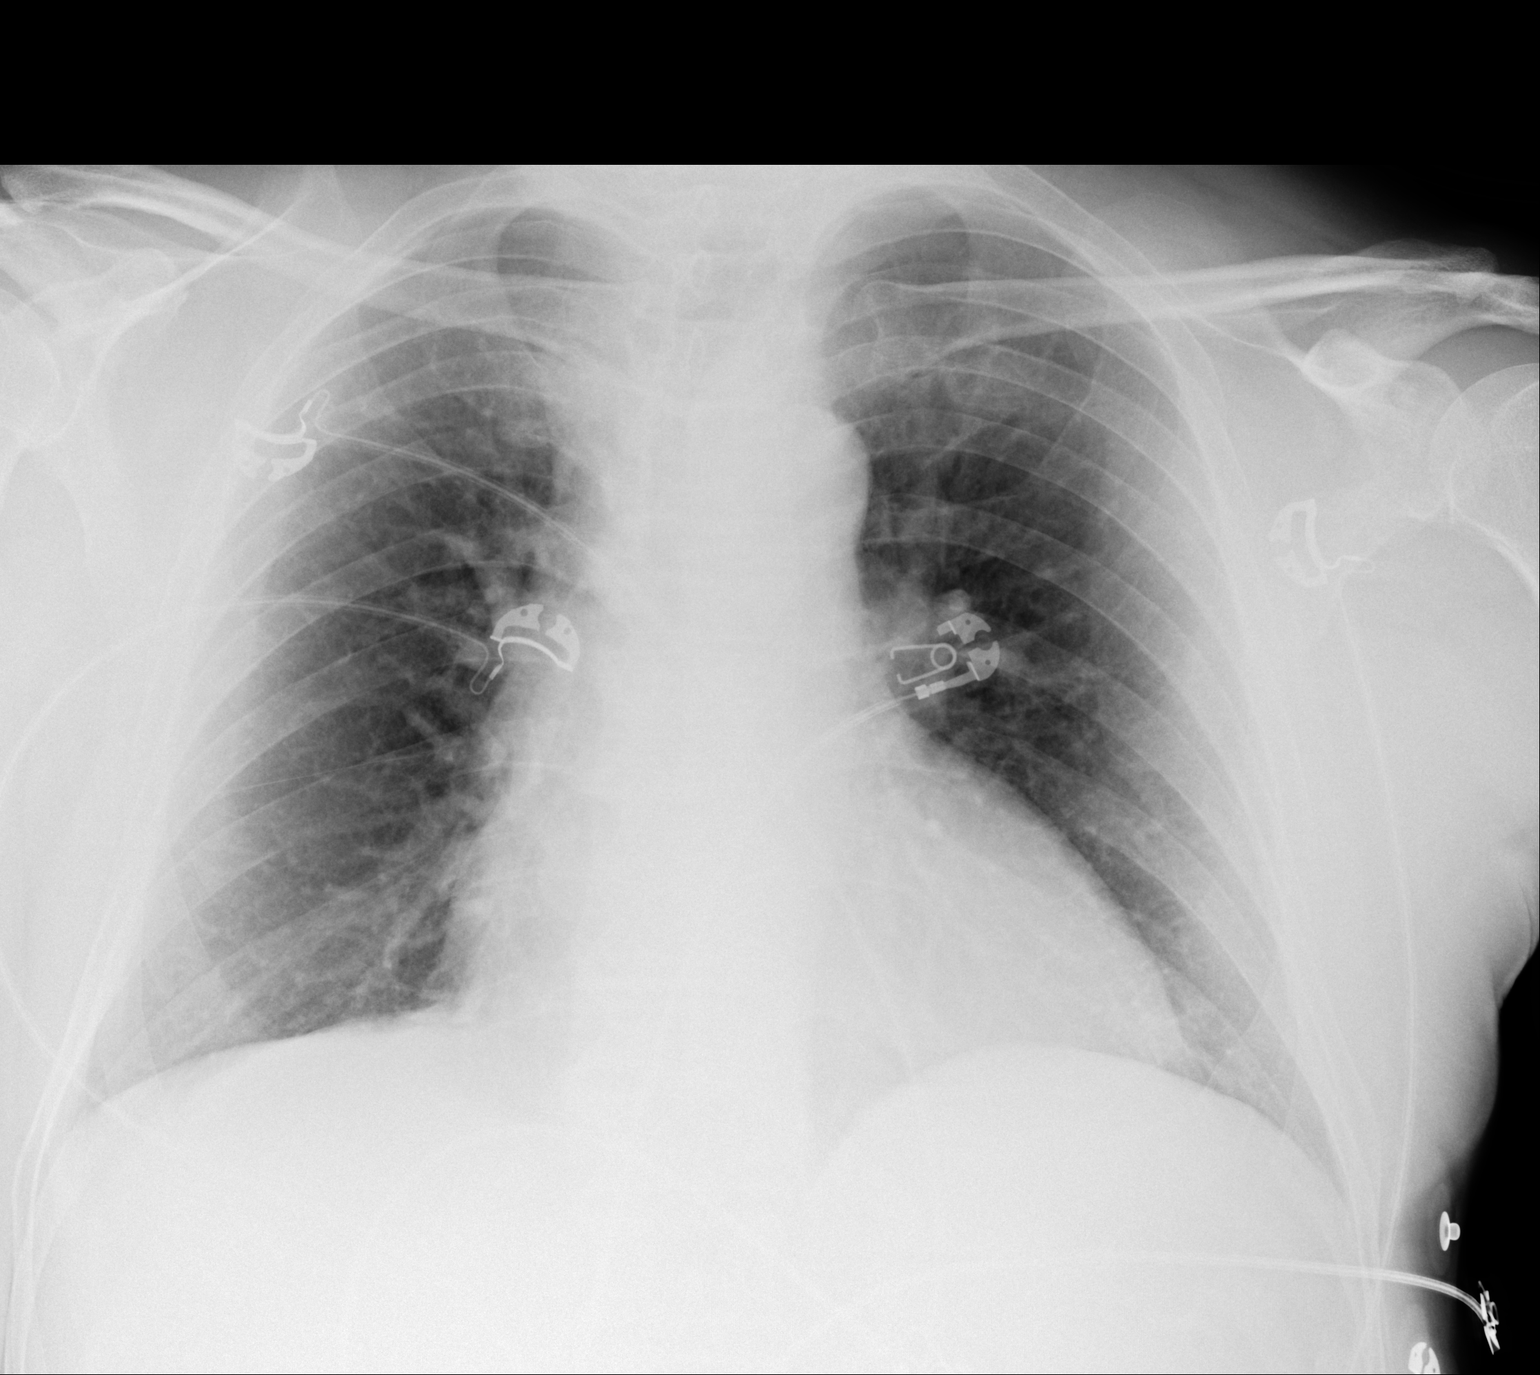
[im 2/2]
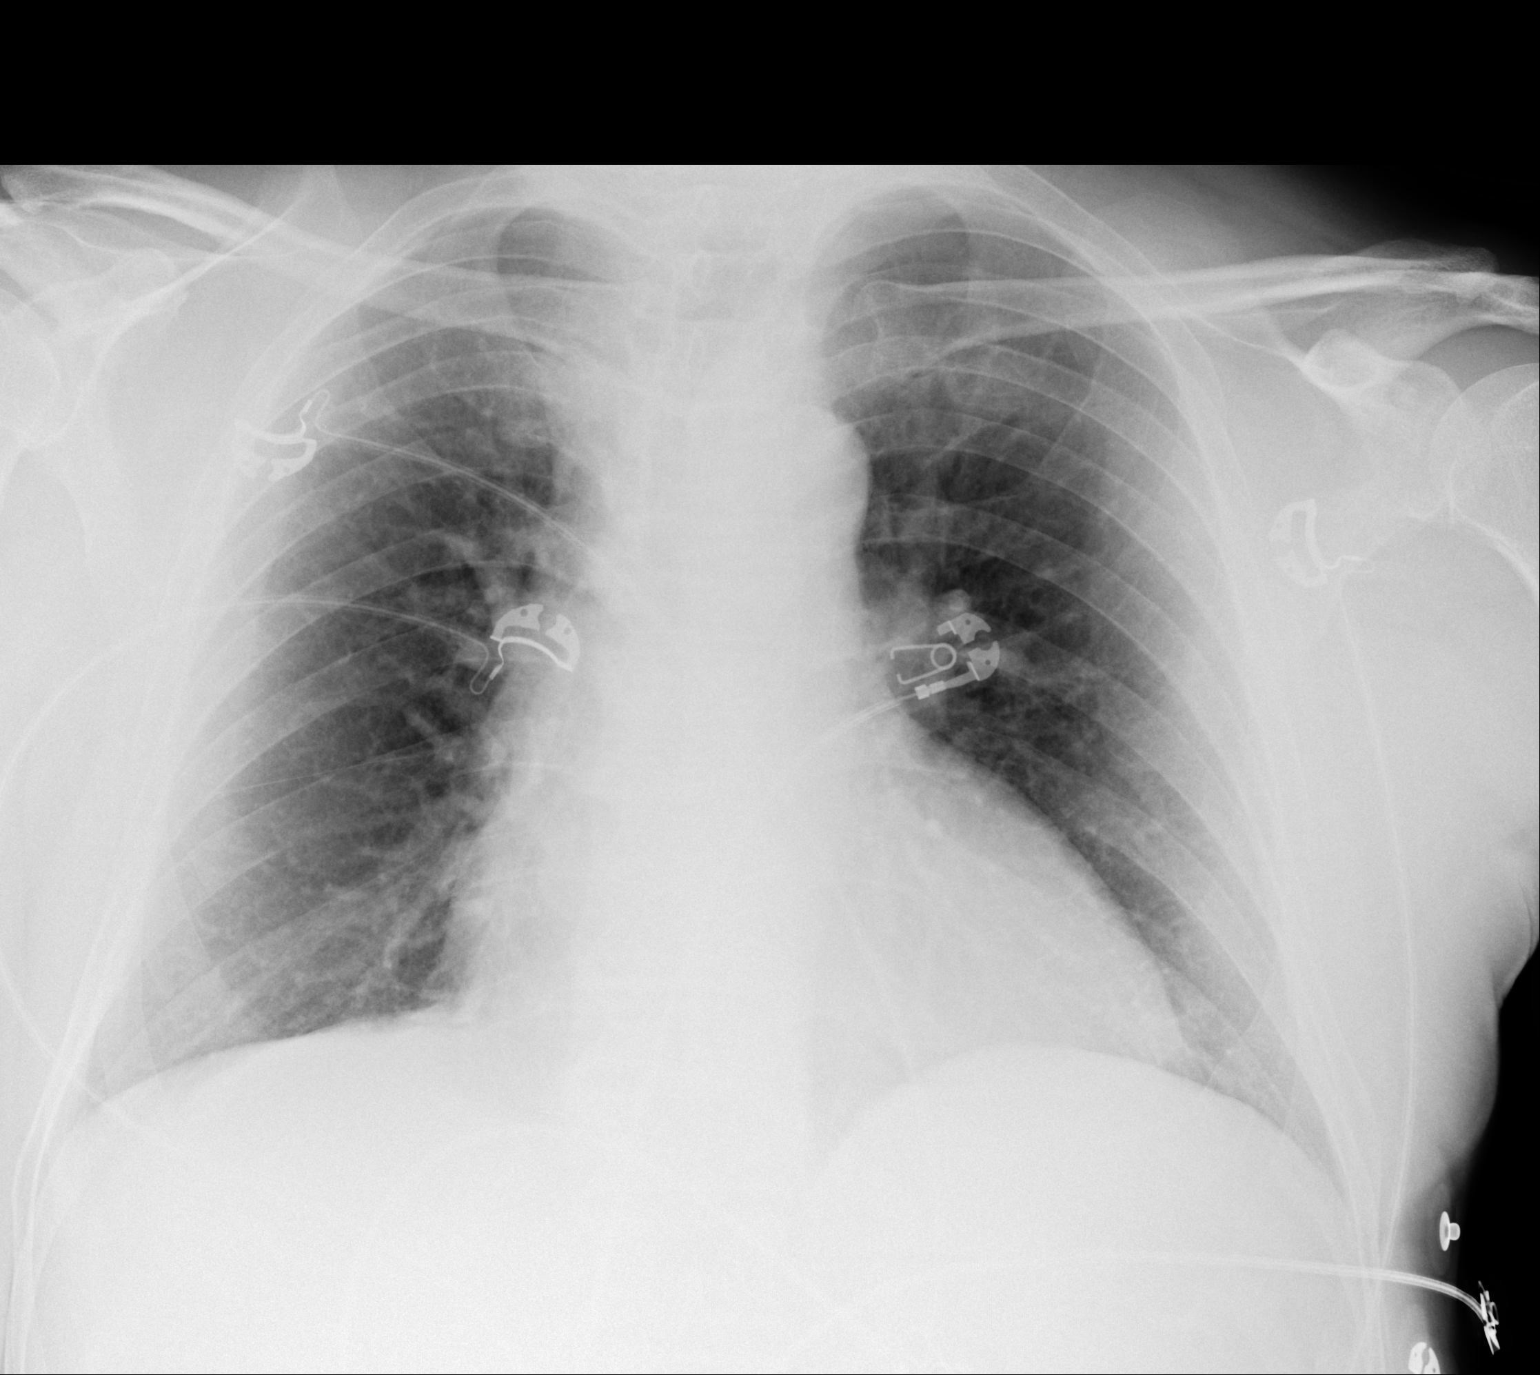

[2 of 2 positions shown; findings below may reference images not displayed]

FINDINGS: Portable AP semi upright view at 0088 hours. Stable cardiac size at
the upper limits of normal to mildly enlarged. Other mediastinal
contours are within normal limits. Allowing for portable technique
the lungs are clear. No pneumothorax or pleural effusion. No acute
osseous abnormality identified.
IMPRESSION: No acute cardiopulmonary abnormality.

## 2022-07-15 ENCOUNTER — Ambulatory Visit: Payer: Self-pay | Admitting: Urology

## 2023-12-19 ENCOUNTER — Encounter: Payer: Self-pay | Admitting: Internal Medicine

## 2023-12-28 ENCOUNTER — Encounter: Payer: Self-pay | Admitting: Internal Medicine

## 2024-03-01 ENCOUNTER — Telehealth: Payer: Self-pay

## 2024-04-16 NOTE — Progress Notes (Signed)
   04/17/2024 12:10 PM   Brent Bryan 18-Apr-1951 [id number]  Referring provider: Catharine Ethelene BIRCH., PA-C [address] [place],  [place] [address]  No chief complaint on file.   HPI:    PMH: Past Medical History:  Diagnosis Date   Diabetes mellitus without complication (HCC)    Hyperlipidemia    Hypertension     Surgical History: No past surgical history on file.  Home Medications:  Allergies as of 04/17/2024   No Known Allergies      Medication List        Accurate as of April 16, 2024 12:10 PM. If you have any questions, ask your nurse or doctor.          amLODipine  10 MG tablet Commonly known as: NORVASC  Take 10 mg by mouth daily.   ferrous sulfate  325 (65 FE) MG tablet Take 1 tablet (325 mg total) by mouth daily with breakfast.   lisinopril  20 MG tablet Commonly known as: ZESTRIL  Take 1 tablet (20 mg total) by mouth daily.   metFORMIN  500 MG 24 hr tablet Commonly known as: GLUCOPHAGE -XR Take 1 tablet (500 mg total) by mouth daily with breakfast.   rosuvastatin  10 MG tablet Commonly known as: CRESTOR  Take 1 tablet (10 mg total) by mouth daily at 6 PM.   tamsulosin  0.4 MG Caps capsule Commonly known as: FLOMAX  Take 1 capsule (0.4 mg total) by mouth daily.        Allergies: No Known Allergies  Family History: No family history on file.  Social History:  reports that he has never smoked. He has never used smokeless tobacco. He reports that he does not drink alcohol and does not use drugs.  ROS: All other review of systems were reviewed and are negative except what is noted above in HPI  Physical Exam: There were no vitals taken for this visit.  Constitutional:  Alert and oriented, No acute distress. HEENT: Brent Bryan AT, moist mucus membranes.  Trachea midline, no masses. Cardiovascular: No clubbing, cyanosis, or edema. Respiratory: Normal respiratory effort, no increased work of breathing. GI: No inguinal hernias GU: Normal  phallus. No masses/lesions on penis, testis, scrotum. Prostate ***g smooth no nodules no induration.  Lymph: No cervical or inguinal lymphadenopathy. Skin: No rashes, bruises or suspicious lesions. Neurologic: Grossly intact, no focal deficits, moving all 4 extremities. Psychiatric: Normal mood and affect.  Laboratory Data: Lab Results  Component Value Date   WBC 4.8 01/07/2017   HGB 14.3 01/07/2017   HCT 44.6 01/07/2017   MCV 94 01/07/2017   PLT 266 01/07/2017    Lab Results  Component Value Date   CREATININE 1.02 01/07/2017    No results found for: PSA  No results found for: TESTOSTERONE  Lab Results  Component Value Date   HGBA1C 6.2 01/07/2017     Assessment:    Plan:    There are no diagnoses linked to this encounter.  No follow-ups on file.  Garnette CHRISTELLA Shack, MD  [facility] [place]

## 2024-04-17 ENCOUNTER — Ambulatory Visit: Payer: Self-pay | Admitting: Urology

## 2024-04-17 ENCOUNTER — Encounter: Payer: Self-pay | Admitting: Urology

## 2024-04-17 VITALS — BP 167/71 | HR 56

## 2024-04-17 DIAGNOSIS — N138 Other obstructive and reflux uropathy: Secondary | ICD-10-CM

## 2024-04-17 DIAGNOSIS — K4090 Unilateral inguinal hernia, without obstruction or gangrene, not specified as recurrent: Secondary | ICD-10-CM

## 2024-04-17 DIAGNOSIS — N401 Enlarged prostate with lower urinary tract symptoms: Secondary | ICD-10-CM

## 2024-04-17 DIAGNOSIS — R1030 Lower abdominal pain, unspecified: Secondary | ICD-10-CM

## 2024-04-17 LAB — URINALYSIS, ROUTINE W REFLEX MICROSCOPIC
Bilirubin, UA: NEGATIVE
Glucose, UA: NEGATIVE
Ketones, UA: NEGATIVE
Leukocytes,UA: NEGATIVE
Nitrite, UA: NEGATIVE
Protein,UA: NEGATIVE
RBC, UA: NEGATIVE
Specific Gravity, UA: <1.005 — ABNORMAL LOW (ref 1.005–1.030)
Urobilinogen, Ur: 0.2 mg/dL (ref 0.2–1.0)
pH, UA: 6 (ref 5.0–7.5)

## 2024-04-17 LAB — BLADDER SCAN AMB NON-IMAGING: Scan Result: 37

## 2024-05-11 ENCOUNTER — Ambulatory Visit: Payer: Self-pay

## 2024-05-11 DIAGNOSIS — Z23 Encounter for immunization: Secondary | ICD-10-CM

## 2024-05-11 NOTE — Congregational Nurse Program (Signed)
 [facility] client into the office today at 12 noon requesting a Flu Vaccine. His PCP is [facility] Last seen in 08/2023  Client provided consent form and reviewed by [name] [name] Client expressed never having a flu vaccine before. Informed client will have him stay for 30 min after vaccine to observe.  Explained with interpreter about the vaccine, VIS reviewed and questions reviewed with [name] interpreter.  Proceeded with Vaccination, post vaccine instructions reviewed such as some redness or swelling at injection site is normal and some muscle soreness at site normal , discussed if some swelling he may apply ice and explained for soreness he may take Ibuprofen on tylenol  whichever his provider has told him he could take as needed.  He states understanding and has no questions. Explained that the vaccine does not cover all possible influenza strains and it could take at least 2 weeks to fully get into his system and to avoid being around others who are sick, if he is to wear a mask and practice good handwashing. He states understanding.  Vaccine administered into Right Deltoid IM and client was observed for 30 minutes without any complaints. Client provided copy of VIS in Spanish and his vaccine card and top copy of consent form.  Vaccine documented under immunizations.   Avelina JONELLE Skeen RN [employer]

## 2024-05-23 NOTE — Telephone Encounter (Signed)
 Opened in error

## 2024-06-24 NOTE — Progress Notes (Unsigned)
 Assessment:  1.  BPH with symptoms.  Benign feeling prostate, normal PSA, he empties well.  Symptoms persist despite him being on Flomax   2.  Lower abdominal pain.  I do not think it is related to his inguinal hernia.  This is longstanding--3 years in nature.  I do not necessarily think it is urologic in nature  3.  Left inguinal hernia, small, I think asymptomatic  Plan:   1.  Continue with the Flomax   2.  I told the patient, through his son the interpreter to limit caffeine as well as to decrease the amount of spicy foods in his diet.  This may help his abdominal pain, certainly may help his lower urinary tract symptoms  3.  I reassured them that he has normal prostate exam and a very low PSA, very low risk of any neoplastic process going on with his prostate  4.  I will have him come back in about 3 months to recheck symptoms   HPI:   10.7.2025: 73 year old Latino male sent by Brent Soho, PA for evaluation and management of BPH with lower urinary tract symptoms.  He is accompanied by his son, Brent Bryan.  The patient is apparently on doxazosin 2 mg a day in addition to tamsulosin  0.4 mg a day.  The patient states that he has a hard time emptying, has day and nighttime frequency.  Also does not feel like he empties well.  Patient states that for the past 3 years he has had lower abdominal pain.  This is persistent, not really made better or worse by urination or bowel movements.  He has had no blood in his urine or stool.  He does have some urgency to urinate.  He does drink a large amount of caffeinated beverages in the morning and eats a lot of spicy foods.  12.16.2025:   PMH: Past Medical History:  Diagnosis Date   Diabetes mellitus without complication (HCC)    Hyperlipidemia    Hypertension     Surgical History: No past surgical history on file.  Home Medications:  Allergies as of 06/26/2024   No Known Allergies      Medication List        Accurate as of  June 24, 2024 10:28 AM. If you have any questions, ask your nurse or doctor.          amLODipine  10 MG tablet Commonly known as: NORVASC  Take 10 mg by mouth daily.   doxazosin 2 MG tablet Commonly known as: CARDURA Take 2 mg by mouth daily.   ferrous sulfate  325 (65 FE) MG tablet Take 1 tablet (325 mg total) by mouth daily with breakfast.   lisinopril  20 MG tablet Commonly known as: ZESTRIL  Take 1 tablet (20 mg total) by mouth daily.   metFORMIN  500 MG 24 hr tablet Commonly known as: GLUCOPHAGE -XR Take 1 tablet (500 mg total) by mouth daily with breakfast.   rosuvastatin  10 MG tablet Commonly known as: CRESTOR  Take 1 tablet (10 mg total) by mouth daily at 6 PM.   tamsulosin  0.4 MG Caps capsule Commonly known as: FLOMAX  Take 1 capsule (0.4 mg total) by mouth daily.        Allergies: No Known Allergies  Family History: No family history on file.  Social History:  reports that he has never smoked. He has never used smokeless tobacco. He reports that he does not drink alcohol and does not use drugs.  ROS: All other review of systems were reviewed and  are negative except what is noted above in HPI  Physical Exam: There were no vitals taken for this visit.  Constitutional:  Alert and oriented, No acute distress. HEENT: [place] AT, moist mucus membranes.  Trachea midline, no masses. Cardiovascular: No clubbing, cyanosis, or edema. Respiratory: Normal respiratory effort, no increased work of breathing. GI: Left inguinal hernia, small, nontender, easily reducible. GU: Normal phallus. No masses/lesions on penis, testis, scrotum. Prostate 40 g smooth no nodules no induration.  Lymph: No cervical or inguinal lymphadenopathy. Skin: No rashes, bruises or suspicious lesions. Neurologic: Grossly intact, no focal deficits, moving all 4 extremities. Psychiatric: Normal mood and affect.  Laboratory Data: Lab Results  Component Value Date   WBC 4.8 01/07/2017   HGB 14.3  01/07/2017   HCT 44.6 01/07/2017   MCV 94 01/07/2017   PLT 266 01/07/2017    Lab Results  Component Value Date   CREATININE 1.02 01/07/2017   PSA performed in February 2025 is 0.38.  No results found for: TESTOSTERONE  Lab Results  Component Value Date   HGBA1C 6.2 01/07/2017

## 2024-06-26 ENCOUNTER — Ambulatory Visit: Payer: Self-pay | Admitting: Urology

## 2024-06-26 VITALS — BP 144/52 | HR 54

## 2024-06-26 DIAGNOSIS — N138 Other obstructive and reflux uropathy: Secondary | ICD-10-CM

## 2024-06-26 DIAGNOSIS — R1030 Lower abdominal pain, unspecified: Secondary | ICD-10-CM

## 2024-06-26 DIAGNOSIS — K4090 Unilateral inguinal hernia, without obstruction or gangrene, not specified as recurrent: Secondary | ICD-10-CM

## 2024-06-26 LAB — URINALYSIS, ROUTINE W REFLEX MICROSCOPIC
Bilirubin, UA: NEGATIVE
Glucose, UA: NEGATIVE
Ketones, UA: NEGATIVE
Leukocytes,UA: NEGATIVE
Nitrite, UA: NEGATIVE
Protein,UA: NEGATIVE
RBC, UA: NEGATIVE
Specific Gravity, UA: 1.01 (ref 1.005–1.030)
Urobilinogen, Ur: 0.2 mg/dL (ref 0.2–1.0)
pH, UA: 6 (ref 5.0–7.5)

## 2024-06-26 NOTE — Progress Notes (Signed)
 Uroflowmetry order received. Patient voided own on with the following results:   Uroflow  Peak Flow: 4ml Average Flow: 3ml Voided Volume: Voiding Time: 78sec Flow Time: 34sec Time to Peak Flow: 8sec   Urine sent for UA
